# Patient Record
Sex: Male | Born: 1975 | Race: White | Hispanic: Yes | Marital: Married | Smoking: Never smoker
Health system: Southern US, Community
[De-identification: ages and names within clinical notes are randomized; demographics above are authoritative.]

## PROBLEM LIST (undated history)

## (undated) DIAGNOSIS — K219 Gastro-esophageal reflux disease without esophagitis: Secondary | ICD-10-CM

## (undated) HISTORY — DX: Gastro-esophageal reflux disease without esophagitis: K21.9

---

## 2013-01-19 ENCOUNTER — Ambulatory Visit: Payer: Self-pay | Admitting: Family Medicine

## 2013-01-19 VITALS — BP 138/82 | HR 86 | Temp 98.5°F | Resp 16 | Ht 65.0 in | Wt 155.0 lb

## 2013-01-19 DIAGNOSIS — J019 Acute sinusitis, unspecified: Secondary | ICD-10-CM

## 2013-01-19 MED ORDER — FLUTICASONE PROPIONATE 50 MCG/ACT NA SUSP: 2.0000 | Freq: Every day | NASAL | Status: DC

## 2013-01-19 MED ORDER — AMOXICILLIN 875 MG PO TABS: 875.0000 mg | ORAL_TABLET | Freq: Two times a day (BID) | ORAL | Status: DC

## 2013-01-19 NOTE — Patient Instructions (Addendum)
Sinusitis  (Sinusitis) La sinusitis es el enrojecimiento, dolor e hinchazn (inflamacin) de los senos paranasales. Los senos paranasales son bolsas de aire que se encuentran dentro de los huesos del rostro (por debajo de los ojos, en la mitad de la frente o por encima de los ojos). En los senos paranasales sanos, el moco es capaz de drenar y el aire circula a travs de ellos en su camino hacia la nariz. Sin embargo, cuando se inflaman, el moco y el aire quedan atrapados. Esto hace que se desarrollen bacterias y otros grmenes y originen una infeccin.   La sinusitis puede desarrollarse rpidamente y durar slo un tiempo corto (aguda) o continuar por un perodo largo (crnica).. La sinusitis que dura ms de 12 semanas se considera crnica.  CAUSAS  Las causas de la sinusitis son:   Alergias.  Las anomalas estructurales, como el desplazamiento del cartlago que separa las fosas nasales (desvo del tabique) pueden disminuir el flujo de aire por la nariz y los senos paranasales y afectar su drenaje.  Las alteraciones funcionales, como cuando los pequeos pelos (cilias) que se encuentran en los senos nasales y ayudan a eliminar la mucosidad no funcionan correctamente o no estn presentes. SNTOMAS  Los sntomas de sinusitis aguda y crnica son los mismos. Los sntomas principales son el dolor y la presin alrededor de los senos paranasales afectados. Otros sntomas son:   Dolor en los dientes superiores.  Dolor de odos  Dolor de cabeza.  Mal aliento.  Disminucin del sentido del olfato y del gusto.  Tos, que empeora al acostarse.  Fatiga.  Fiebre.  Drenaje de moco espeso por la nariz, que generalmente es de color verde y puede contener pus (purulento).  Hinchazn y calor en los senos paranasales afectados. DIAGNSTICO  El mdico le har un examen fsico. Durante el examen, el mdico:   Revisar su nariz buscando signos de crecimientos anormales en las fosas nasales (plipos  nasales).  Palpar los senos paranasales afectados para buscar signos de infeccin.  Observar el interior de los senos paranasales (endoscopa) con un dispositivo luminoso especial (endoscopio) con el que tomar imgenes insertndolo en los senos paranasales. Si el mdico sospecha que usted sufre sinusitis crnica, podr indicar una o ms de las siguientes pruebas:   Pruebas de alergia.  Cultivo de secreciones nasales: tomar una muestra del moco nasal y lo enviar a un laboratorio para detectar bacterias.  Citologa nasal: el mdico tomar una muestra de moco de la nariz para determinar si la sinusitis que usted sufre est relacionada con una alergia. TRATAMIENTO  La mayora de los casos de sinusitis aguda se deben a una infeccin viral y se resuelven espontneamente dentro de los 10 das. En algunos casos se recetan medicamentos para aliviar los sntomas (analgsicos, descongestivos, aerosoles nasales con corticoides o aerosoles salinos).  Sin embargo, para la sinusitis por infeccin bacteriana, el mdico le recetar antibiticos. Los antibiticos son medicamentos que destruyen las bacterias que causan la infeccin.  Rara vez la sinusitis tiene su origen en una infeccin por hongos. . En estos casos, el mdico le recetar un medicamento antifngico.  Para algunos casos de sinusitis crnica, es necesario someterse a una ciruga. Generalmente se trata de casos en los que la sinusitis se repite ms de 3 veces al ao, a pesar de otros tratamientos.  INSTRUCCIONES PARA EL CUIDADO EN EL HOGAR   Beba gran cantidad de lquidos. Los lquidos ayudan a disolver el moco para que drene ms fcilmente de los senos paranasales.    Use un humidificador.  Inhale vapor de 3 a 4 veces al da (por ejemplo, sintese en el bao con la ducha abierta).  Aplique un pao tibio y hmedo en el rostro 3  4 veces al da, o segn las indicaciones de su mdico.  Use un aerosol nasal salino para ayudar a Environmental education officer y  [employer] senos nasales.  Tome medicamentos de venta libre o recetados para Acupuncturist, Environmental health practitioner o la fiebre slo segn las indicaciones de su mdico. SOLICITE ATENCIN MDICA DE INMEDIATO SI:   Siente ms dolor o sufre dolores de cabeza intensos.  Tiene nuseas, vmitos o somnolencia.  Observa hinchazn alrededor del rostro.  Tiene problemas de visin.  Presenta rigidez en el cuello.  Tiene dificultad para respirar. ASEGRESE DE QUE:   Comprende estas instrucciones.  Controlar su enfermedad.  Solicitar ayuda de inmediato si no mejora o si empeora. Document Released: 08/02/2005 Document Revised: 01/15/2012 [facility] Patient Information 2013 [place], [place].

## 2013-01-19 NOTE — Progress Notes (Signed)
Patient ID: Ivan Rodriguez MRN: [id number], DOB: 01-25-1976, 37 y.o. Date of Encounter: 01/19/2013, 6:09 PM  Primary Physician: No primary provider on file.  Chief Complaint:  Chief Complaint  Patient presents with  . Sinusitis    x 1 week    HPI: 37 y.o. year old male presents with 21 day history of nasal congestion, post nasal drip, sore throat, sinus pressure, and cough. Afebrile. No chills. Nasal congestion thick and green/yellow. Sinus pressure is the worst symptom. Cough is productive secondary to post nasal drip and not associated with time of day. Ears feel full, leading to sensation of muffled hearing. Has tried OTC cold preps without success. 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   Some epistaxis.  Last sinus infection 5 years ago.  No allergy hx.  No recent antibiotics, recent travels, or sick contacts   No leg trauma, sedentary periods, h/o cancer, or tobacco use.  History reviewed. No pertinent past medical history.   Home Meds: Prior to Admission medications   Medication Sig Start Date End Date Taking? Authorizing Provider  amoxicillin (AMOXIL) 875 MG tablet Take 1 tablet (875 mg total) by mouth 2 (two) times daily. 01/19/13   Elvina Sidle, MD  fluticasone (FLONASE) 50 MCG/ACT nasal spray Place 2 sprays into the nose daily. 01/19/13   Elvina Sidle, MD    Allergies: No Known Allergies  History   Social History  . Marital Status: Married    Spouse Name: N/A    Number of Children: N/A  . Years of Education: N/A   Occupational History  . Not on file.   Social History Main Topics  . Smoking status: Never Smoker   . Smokeless tobacco: Not on file  . Alcohol Use: Not on file  . Drug Use: Not on file  . Sexually Active: Not on file   Other Topics Concern  . Not on file   Social History Narrative  . No narrative on file     Review of Systems: Constitutional: negative for chills, fever, night sweats or weight changes Cardiovascular:  negative for chest pain or palpitations Respiratory: negative for hemoptysis, wheezing, or shortness of breath Abdominal: negative for abdominal pain, nausea, vomiting or diarrhea Dermatological: negative for rash Neurologic: negative for headache   Physical Exam: Blood pressure 138/82, pulse 86, temperature 98.5 F (36.9 C), temperature source Oral, resp. rate 16, height 5\' 5"  (1.651 m), weight 155 lb (70.308 kg), SpO2 96.00%., Body mass index is 25.79 kg/(m^2). General: Well developed, well nourished, in no acute distress. Head: Normocephalic, atraumatic, eyes without discharge, sclera non-icteric, nares are congested. Bilateral auditory canals clear, TM's are without perforation, pearly grey with reflective cone of light bilaterally. Serous effusion bilaterally behind TM's. Maxillary sinus TTP. Oral cavity moist, dentition normal. Posterior pharynx with post nasal drip and mild erythema. No peritonsillar abscess or tonsillar exudate. Neck: Supple. No thyromegaly. Full ROM. No lymphadenopathy. Lungs: Clear bilaterally to auscultation without wheezes, rales, or rhonchi. Breathing is unlabored.  Heart: RRR with S1 S2. No murmurs, rubs, or gallops appreciated. Msk:  Strength and tone normal for age. Extremities: No clubbing or cyanosis. No edema. Neuro: Alert and oriented X 3. Moves all extremities spontaneously. CNII-XII grossly in tact. Psych:  Responds to questions appropriately with a normal affect.   Labs:   ASSESSMENT AND PLAN:  37 y.o. year old male with sinusitis Acute sinusitis - Plan: amoxicillin (AMOXIL) 875 MG tablet, fluticasone (FLONASE) 50 MCG/ACT nasal spray   -  -Tylenol/Motrin prn -Rest/fluids -  RTC precautions -RTC 3-5 days if no improvement  Signed, Elvina Sidle, MD 01/19/2013 6:09 PM

## 2013-04-01 ENCOUNTER — Ambulatory Visit: Payer: Self-pay | Admitting: Family Medicine

## 2013-04-01 VITALS — BP 110/70 | HR 78 | Temp 97.9°F | Resp 16 | Ht 66.0 in | Wt 152.0 lb

## 2013-04-01 DIAGNOSIS — R197 Diarrhea, unspecified: Secondary | ICD-10-CM

## 2013-04-01 DIAGNOSIS — R1013 Epigastric pain: Secondary | ICD-10-CM

## 2013-04-01 DIAGNOSIS — R112 Nausea with vomiting, unspecified: Secondary | ICD-10-CM

## 2013-04-01 LAB — COMPREHENSIVE METABOLIC PANEL
ALT: 21 U/L (ref 0–53)
AST: 21 U/L (ref 0–37)
Albumin: 4.9 g/dL (ref 3.5–5.2)
Alkaline Phosphatase: 63 U/L (ref 39–117)
BUN: 17 mg/dL (ref 6–23)
Potassium: 4.2 mEq/L (ref 3.5–5.3)
Sodium: 140 mEq/L (ref 135–145)

## 2013-04-01 LAB — POCT CBC
Granulocyte percent: 86.4 %G — AB (ref 37–80)
MCV: 89.2 fL (ref 80–97)
MID (cbc): 0.6 (ref 0–0.9)
MPV: 9.9 fL (ref 0–99.8)
POC Granulocyte: 9.1 — AB (ref 2–6.9)
POC LYMPH PERCENT: 8.1 %L — AB (ref 10–50)
POC MID %: 5.5 %M (ref 0–12)
Platelet Count, POC: 155 10*3/uL (ref 142–424)
RBC: 5.4 M/uL (ref 4.69–6.13)
RDW, POC: 14 %

## 2013-04-01 LAB — POCT UA - MICROSCOPIC ONLY
Casts, Ur, LPF, POC: NEGATIVE
Crystals, Ur, HPF, POC: NEGATIVE
Yeast, UA: NEGATIVE

## 2013-04-01 LAB — POCT URINALYSIS DIPSTICK
Blood, UA: NEGATIVE
Protein, UA: NEGATIVE
Spec Grav, UA: >=1.03
Urobilinogen, UA: 0.2

## 2013-04-01 MED ORDER — DICYCLOMINE HCL 10 MG PO CAPS: ORAL_CAPSULE | ORAL | Status: DC

## 2013-04-01 MED ORDER — ONDANSETRON 4 MG PO TBDP: ORAL_TABLET | ORAL | Status: DC

## 2013-04-01 NOTE — Patient Instructions (Signed)
Drink plenty of fluids  Return if abruptly worse  If the medications that I prescribed did not help the diarrhea, you can also take over-the-counter Imodium.

## 2013-04-01 NOTE — Progress Notes (Signed)
Subjective: 37 year old male who does not speak much [place]. He has an interpreter with him. Last night he awakened about 2 AM with epigastric pain. He vomited several times, last time on the way over here. He also had diarrhea. He has not had any fever. He did drink about 4 beers 2 days ago. He had eaten some things and he wondered whether that could have upset his stomach. No one else in the family is sick. He does have a history of getting some diarrheal problems intermittently. The cramping pain is not present right now, the though it has come and gone.   He is married and has a child. He works as a Occupational hygienist.  Objective: Healthy-appearing man in no major distress. Chest clear. Heart regular without murmurs. Abdomen had diminished bowel sounds, but occasional was present. Abdomen soft without megaly or masses. Mild epigastric tenderness.  Assessment: Abdominal pain Nausea and vomiting Diarrhea Gastroenteritis  Plan: The patient would feel more comfortable recheck labs on him, though I explained that symptomatic treatment was probably all that was needed. We'll check a CBC C. met and UA on him and be back within a few minutes. Neck    Results for orders placed in visit on 04/01/13  POCT CBC      Result Value Range   WBC 10.5 (*) 4.6 - 10.2 K/uL   Lymph, poc 0.9  0.6 - 3.4   POC LYMPH PERCENT 8.1 (*) 10 - 50 %L   MID (cbc) 0.6  0 - 0.9   POC MID % 5.5  0 - 12 %M   POC Granulocyte 9.1 (*) 2 - 6.9   Granulocyte percent 86.4 (*) 37 - 80 %G   RBC 5.40  4.69 - 6.13 M/uL   Hemoglobin 15.8  14.1 - 18.1 g/dL   HCT, POC 84.6  96.2 - 53.7 %   MCV 89.2  80 - 97 fL   MCH, POC 29.3  27 - 31.2 pg   MCHC 32.8  31.8 - 35.4 g/dL   RDW, POC 95.2     Platelet Count, POC 155  142 - 424 K/uL   MPV 9.9  0 - 99.8 fL  POCT UA - MICROSCOPIC ONLY      Result Value Range   WBC, Ur, HPF, POC 1-3     RBC, urine, microscopic 0-2     Bacteria, U Microscopic small     Mucus, UA 2+     Epithelial cells, urine per micros 2-5     Crystals, Ur, HPF, POC neg     Casts, Ur, LPF, POC neg     Yeast, UA neg    POCT URINALYSIS DIPSTICK      Result Value Range   Color, UA yellow     Clarity, UA clear     Glucose, UA neg     Bilirubin, UA small     Ketones, UA neg     Spec Grav, UA >=1.030     Blood, UA neg     pH, UA 5.0     Protein, UA neg     Urobilinogen, UA 0.2     Nitrite, UA neg     Leukocytes, UA Negative

## 2013-04-02 ENCOUNTER — Encounter: Payer: Self-pay | Admitting: *Deleted

## 2017-06-09 ENCOUNTER — Encounter: Payer: Self-pay | Admitting: Emergency Medicine

## 2017-06-09 ENCOUNTER — Ambulatory Visit (INDEPENDENT_AMBULATORY_CARE_PROVIDER_SITE_OTHER): Payer: Self-pay | Admitting: Emergency Medicine

## 2017-06-09 VITALS — BP 126/77 | HR 65 | Temp 98.6°F | Resp 18 | Ht 66.14 in | Wt 162.0 lb

## 2017-06-09 DIAGNOSIS — M7022 Olecranon bursitis, left elbow: Secondary | ICD-10-CM | POA: Insufficient documentation

## 2017-06-09 DIAGNOSIS — M25522 Pain in left elbow: Secondary | ICD-10-CM

## 2017-06-09 MED ORDER — IBUPROFEN 600 MG PO TABS: 600.0000 mg | ORAL_TABLET | Freq: Three times a day (TID) | ORAL | 1 refills | Status: DC | PRN

## 2017-06-09 MED ORDER — CEPHALEXIN 500 MG PO CAPS: 500.0000 mg | ORAL_CAPSULE | Freq: Three times a day (TID) | ORAL | 0 refills | Status: AC

## 2017-06-09 NOTE — Progress Notes (Signed)
Ivan Rodriguez 41 y.o.   Chief Complaint  Patient presents with  . Elbow Pain    left elbow x 3 days     HISTORY OF PRESENT ILLNESS: This is a 41 y.o. male complaining of left elbow pain x 3 days; denies injury.  HPI   Prior to Admission medications   Not on File    No Known Allergies  There are no active problems to display for this patient.   History reviewed. No pertinent past medical history.  History reviewed. No pertinent surgical history.  Social History   Social History  . Marital status: Married    Spouse name: N/A  . Number of children: N/A  . Years of education: N/A   Occupational History  . Not on file.   Social History Main Topics  . Smoking status: Never Smoker  . Smokeless tobacco: Never Used  . Alcohol use Not on file  . Drug use: Unknown  . Sexual activity: Not on file   Other Topics Concern  . Not on file   Social History Narrative  . No narrative on file    History reviewed. No pertinent family history.   Review of Systems  Constitutional: Negative for chills and fever.  HENT: Negative.   Eyes: Negative.   Respiratory: Negative for cough and shortness of breath.   Cardiovascular: Negative for chest pain and palpitations.  Gastrointestinal: Negative for nausea and vomiting.  Musculoskeletal: Positive for joint pain.  Skin: Positive for rash.  Neurological: Negative.   Endo/Heme/Allergies: Negative.   All other systems reviewed and are negative.     Vitals:   06/09/17 1458  BP: 126/77  Pulse: 65  Resp: 18  Temp: 98.6 F (37 C)    Physical Exam  Constitutional: He is oriented to person, place, and time. He appears well-developed and well-nourished.  HENT:  Head: Normocephalic and atraumatic.  Eyes: Pupils are equal, round, and reactive to light. EOM are normal.  Neck: Normal range of motion. Neck supple.  Cardiovascular: Normal rate and regular rhythm.   Pulmonary/Chest: Effort normal.  Musculoskeletal:   Left elbow: FROM; +erythema and mild swelling; NVI  Neurological: He is alert and oriented to person, place, and time. No sensory deficit. He exhibits normal muscle tone.  Skin: Skin is warm and dry. Capillary refill takes less than 2 seconds. No rash noted.  Psychiatric: He has a normal mood and affect. His behavior is normal.  Vitals reviewed.    ASSESSMENT & PLAN: [name] was seen today for elbow pain.  Diagnoses and all orders for this visit:  Left elbow pain  Olecranon bursitis of left elbow Comments: possible infection  Other orders -     cephALEXin (KEFLEX) 500 MG capsule; Take 1 capsule (500 mg total) by mouth 3 (three) times daily. -     ibuprofen (ADVIL,MOTRIN) 600 MG tablet; Take 1 tablet (600 mg total) by mouth every 8 (eight) hours as needed.    Patient Instructions       IF you received an x-ray today, you will receive an invoice from [facility]. Please contact [facility] at [phone] with questions or concerns regarding your invoice.   IF you received labwork today, you will receive an invoice from [facility]. Please contact [facility] at [phone] with questions or concerns regarding your invoice.   Our billing staff will not be able to assist you with questions regarding bills from these companies.  You will be contacted with the lab results as soon as  they are available. The fastest way to get your results is to activate your My Chart account. Instructions are located on the last page of this paperwork. If you have not heard from us regarding the results in 2 weeks, please contact this office.      Bursitis (Bursitis) Se produce bursitis cuando la bolsa llena de lquido (bursa) que cubre y protege una articulacin se hincha (inflama). La bursitis es ms frecuente cerca de las articulaciones, especialmente de las rodillas, los codos, las caderas y los hombros. CUIDADOS EN EL HOGAR  Tome los medicamentos solamente como se lo haya  indicado el mdico.  Si le recetaron antibiticos, asegrese de terminarlos, incluso si comienza a sentirse mejor.  Haga reposo a fin de Scientist, water qualitydescansar el rea afectada, como se lo haya indicado el mdico. ? Mantenga el rea elevada. ? Evite hacer cosas que Warden/rangerempeoren el dolor.  Aplique hielo sobre la zona lesionada. ? Coloque el hielo en una bolsa plstica. ? Coloque una First[employer] Corptoalla entre la piel y la bolsa de hielo. ? Coloque el hielo durante 20 minutos, 2 a 3 veces por da.  Use la frula, el dispositivo ortopdico, la almohadilla o el dispositivo para caminar como se lo haya indicado el mdico.  Concurra a todas las visitas de control como se lo haya indicado el mdico. Esto es importante. SOLICITE AYUDA SI:  Tiene ms dolor con los Medical laboratory scientific officercuidados en el hogar.  Tiene fiebre.  Tiene escalofros. Esta informacin no tiene Theme park managercomo fin reemplazar el consejo del mdico. Asegrese de hacerle al mdico cualquier pregunta que tenga. Document Released: 10/12/2011 Document Revised: 11/13/2014 Document Reviewed: 01/12/2014 Elsevier Interactive Patient Education  2018 Elsevier Inc.     Edwina BarthMiguel Jaylei Fuerte, MD [facility]

## 2017-06-09 NOTE — Patient Instructions (Addendum)
     IF you received an x-ray today, you will receive an invoice from [facility]. Please contact [facility] at [phone] with questions or concerns regarding your invoice.   IF you received labwork today, you will receive an invoice from [facility]. Please contact [facility] at [phone] with questions or concerns regarding your invoice.   Our billing staff will not be able to assist you with questions regarding bills from these companies.  You will be contacted with the lab results as soon as they are available. The fastest way to get your results is to activate your My Chart account. Instructions are located on the last page of this paperwork. If you have not heard from us regarding the results in 2 weeks, please contact this office.      Bursitis (Bursitis) Se produce bursitis cuando la bolsa llena de lquido (bursa) que cubre y protege una articulacin se hincha (inflama). La bursitis es ms frecuente cerca de las articulaciones, especialmente de las rodillas, los codos, las caderas y los hombros. CUIDADOS EN EL HOGAR  Tome los medicamentos solamente como se lo haya indicado el mdico.  Si le recetaron antibiticos, asegrese de terminarlos, incluso si comienza a sentirse mejor.  Haga reposo a fin de descansar el rea afectada, como se lo haya indicado el mdico. ? Mantenga el rea elevada. ? Evite hacer cosas que empeoren el dolor.  Aplique hielo sobre la zona lesionada. ? Coloque el hielo en una bolsa plstica. ? Coloque una toalla entre la piel y la bolsa de hielo. ? Coloque el hielo durante 20 minutos, 2 a 3 veces por da.  Use la frula, el dispositivo ortopdico, la almohadilla o el dispositivo para caminar como se lo haya indicado el mdico.  Concurra a todas las visitas de control como se lo haya indicado el mdico. Esto es importante. SOLICITE AYUDA SI:  Tiene ms dolor con los cuidados en el hogar.  Tiene fiebre.  Tiene escalofros. Esta  informacin no tiene como fin reemplazar el consejo del mdico. Asegrese de hacerle al mdico cualquier pregunta que tenga. Document Released: 10/12/2011 Document Revised: 11/13/2014 Document Reviewed: 01/12/2014 Elsevier Interactive Patient Education  2018 Elsevier Inc.  

## 2019-01-27 ENCOUNTER — Encounter (HOSPITAL_COMMUNITY): Payer: Self-pay

## 2019-01-27 ENCOUNTER — Other Ambulatory Visit: Payer: Self-pay

## 2019-01-27 ENCOUNTER — Ambulatory Visit (HOSPITAL_COMMUNITY)
Admission: EM | Admit: 2019-01-27 | Discharge: 2019-01-27 | Disposition: A | Discharge status: Home / Self Care | Payer: Self-pay | Attending: Internal Medicine | Admitting: Internal Medicine

## 2019-01-27 ENCOUNTER — Ambulatory Visit (INDEPENDENT_AMBULATORY_CARE_PROVIDER_SITE_OTHER): Payer: Self-pay

## 2019-01-27 DIAGNOSIS — R1013 Epigastric pain: Secondary | ICD-10-CM

## 2019-01-27 DIAGNOSIS — K2100 Gastro-esophageal reflux disease with esophagitis, without bleeding: Secondary | ICD-10-CM

## 2019-01-27 DIAGNOSIS — K5900 Constipation, unspecified: Secondary | ICD-10-CM

## 2019-01-27 DIAGNOSIS — K219 Gastro-esophageal reflux disease without esophagitis: Secondary | ICD-10-CM | POA: Insufficient documentation

## 2019-01-27 DIAGNOSIS — K5909 Other constipation: Secondary | ICD-10-CM

## 2019-01-27 MED ORDER — ALUM & MAG HYDROXIDE-SIMETH 200-200-20 MG/5ML PO SUSP
ORAL | Status: AC
  Filled 2019-01-27: qty 30

## 2019-01-27 MED ORDER — OMEPRAZOLE 40 MG PO CPDR: 40.0000 mg | DELAYED_RELEASE_CAPSULE | Freq: Every day | ORAL | 1 refills | Status: DC

## 2019-01-27 MED ORDER — ALUM & MAG HYDROXIDE-SIMETH 200-200-20 MG/5ML PO SUSP
30.0000 mL | Freq: Once | ORAL | Status: AC
  Administered 2019-01-27: 30 mL via ORAL

## 2019-01-27 MED ORDER — HYOSCYAMINE SULFATE 0.125 MG SL SUBL: 0.2500 mg | SUBLINGUAL_TABLET | Freq: Once | SUBLINGUAL | Status: DC

## 2019-01-27 NOTE — ED Triage Notes (Signed)
Pt cc states his acid reflux is flaring up. X 4 days.

## 2019-01-27 NOTE — ED Provider Notes (Signed)
[facility]    CSN: [id number] Arrival date & time: 01/27/19  1546     History   Chief Complaint Chief Complaint  Patient presents with   acid reflux    HPI Ivan Rodriguez is a 43 y.o. male.   For the past 3 days has been having  Epigastric fullness and feeling bitter stuff coming to his throat and feels pressure feeling moving up his chest almost to his nostrils. No fever, blood in stool and N/V/D. Denies taking any meds for symptoms. Has felt sometimes his food getting stuck in his esophagus and he cant digest his food well. Has noticed intermittent burning in his esophagus when he swallows some warm food and certain spicy foods. Used to get them intermittent and no in the past 3 days is constant.  He normally has BM's twice a day, but 4 out 7 days been only going ones a day, because he cant go while at work, so he hold it til he gets home at night time.      History reviewed. No pertinent past medical history.  Patient Active Problem List   Diagnosis Date Noted   Left elbow pain 06/09/2017   Olecranon bursitis of left elbow 06/09/2017    History reviewed. No pertinent surgical history.     Home Medications    Prior to Admission medications   Medication Sig Start Date End Date Taking? Authorizing Provider  omeprazole (PRILOSEC) 40 MG capsule Take 1 capsule (40 mg total) by mouth daily. 01/27/19   Rodriguez-Southworth, Nettie Elm, PA-C    Family History Family History  Problem Relation Age of Onset   Hypertension Mother    Gallstones Mother    Throat cancer Father     Social History Social History   Tobacco Use   Smoking status: Never Smoker   Smokeless tobacco: Never Used  Substance Use Topics   Alcohol use: Not Currently   Drug use: Not on file     Allergies   Patient has no known allergies.   Review of Systems Review of Systems  Constitutional: Negative for chills, diaphoresis and fever.  HENT: Negative for  congestion, postnasal drip, rhinorrhea and sore throat.   Respiratory: Positive for chest tightness. Negative for cough, choking, shortness of breath and wheezing.   Cardiovascular: Negative for chest pain, palpitations and leg swelling.  Gastrointestinal: Positive for abdominal pain and constipation. Negative for abdominal distention, blood in stool, diarrhea, nausea and vomiting.       Gets reflux off and on. Has sensation that food gets stuck in his throat.   Genitourinary: Negative for difficulty urinating.  Musculoskeletal: Negative for gait problem, myalgias, neck pain and neck stiffness.  Skin: Negative for rash and wound.  Neurological: Negative for dizziness, weakness and light-headedness.  Hematological: Negative for adenopathy.   Physical Exam Triage Vital Signs ED Triage Vitals  Enc Vitals Group     BP 01/27/19 1614 (!) 142/80     Pulse Rate 01/27/19 1614 62     Resp 01/27/19 1614 18     Temp 01/27/19 1614 98.6 F (37 C)     Temp Source 01/27/19 1614 Oral     SpO2 01/27/19 1614 100 %     Weight 01/27/19 1616 165 lb (74.8 kg)     Height --      Head Circumference --      Peak Flow --      Pain Score 01/27/19 1616 6     Pain Loc --  Pain Edu? --      Excl. in GC? --    No data found.  Updated Vital Signs BP (!) 142/80 (BP Location: Right Arm)    Pulse 62    Temp 98.6 F (37 C) (Oral)    Resp 18    Wt 165 lb (74.8 kg)    SpO2 100%    BMI 26.52 kg/m   Visual Acuity Right Eye Distance:   Left Eye Distance:   Bilateral Distance:    Right Eye Near:   Left Eye Near:    Bilateral Near:     Physical Exam Physical Exam Vitals signs and nursing note reviewed.  Constitutional:      General: She is not in acute distress.    Appearance: Normal appearance. She is not ill-appearing, toxic-appearing or diaphoretic.  HENT:     Head: Normocephalic.     Right Ear: Tympanic membrane, ear canal and external ear normal.     Left Ear: Tympanic membrane, ear canal and  external ear normal.     Nose: Nose normal.     Mouth/Throat:     Mouth: Mucous membranes are moist.  Eyes:     General: No scleral icterus.       Right eye: No discharge.        Left eye: No discharge.     Conjunctiva/sclera: Conjunctivae normal.  Neck:     Musculoskeletal: Neck supple. No neck rigidity.  Cardiovascular:     Rate and Rhythm: Normal rate and regular rhythm.     Heart sounds: No murmur.  Pulmonary:     Effort: Pulmonary effort is normal.     Breath sounds: Normal breath sounds.  Abdominal:     General: Bowel sounds are normal. There is no distension.     Palpations: Abdomen is soft. There is no mass or organoegally.     Tenderness: There is no abdominal tenderness, but the pressure was provoked with palpation of epigastric region and LUQ. There is no guarding or rebound.     Hernia: No hernia is present.  Musculoskeletal: Normal range of motion.  Lymphadenopathy:     Cervical: No cervical adenopathy.  Skin:    General: Skin is warm and dry.     Coloration: Skin is not jaundiced.     Findings: No rash.  Neurological:     Mental Status: She is alert and oriented to person, place, and time.     Gait: Gait normal.  Psychiatric:        Mood and Affect: Mood normal.        Behavior: Behavior normal.        Thought Content: Thought content normal.        Judgment: Judgment normal.    UC Treatments / Results  Labs (all labs ordered are listed, but only abnormal results are displayed) Labs Reviewed - No data to display  EKG None  Radiology Dg Abd 1 View  Result Date: 01/27/2019 CLINICAL DATA:  Epigastric pressure; constipation EXAM: ABDOMEN - 1 VIEW COMPARISON:  None. FINDINGS: There is moderate stool throughout the colon. There is no bowel dilatation or air-fluid level to suggest bowel obstruction. No free air. No abnormal calcifications. IMPRESSION: Moderate stool in colon.  No bowel obstruction or free air evident. Electronically Signed   By: Bretta Bang III M.D.   On: 01/27/2019 18:47    Procedures Procedures  Medications Ordered in UC Medications  alum & mag hydroxide-simeth (MAALOX/MYLANTA) 200-200-20 MG/5ML suspension 30  mL (30 mLs Oral Given 01/27/19 1657)    Initial Impression / Assessment and Plan / UC Course  I have reviewed the triage vital signs and the nursing notes. He was given a GI cocktail and his discomfort was minimally alleviated, went from 8/10 to 6/10 and by discharge time was 2/10.  Pertinent  imaging results that were available during my care of the patient were reviewed by me and considered in my medical decision making (see chart for details). I advised him to get Magnesium citrate to help him empty out his colon since he has a large amt . Of stool on ascending colon.  I placed him on Prilosec 40 mg qd x 4 weeks and if in 2-3 weeks he is not better, then needs to see Gi. If they require him to be referred, then he can come see me as PCP and I will ref. Pt agrees.    Final Clinical Impressions(s) / UC Diagnoses   Final diagnoses:  Gastroesophageal reflux disease with esophagitis     Discharge Instructions     Come papaya con todas tus comidas para que te ayude a [employer] Prilosec 40 mg y tome una cada manana 30 minutos antes del desayuno. Trate por 2-4 semanas, so no te ayuda, tendras que ver a un especialista de estomago.   Compra una botella que se llama Magnesium Citrate to toma toda el dia antes que no trabajes para baciar el intestino largo.     ED Prescriptions    Medication Sig Dispense Auth. Provider   omeprazole (PRILOSEC) 40 MG capsule Take 1 capsule (40 mg total) by mouth daily. 4 capsule Rodriguez-Southworth, Nettie Elm, PA-C     Controlled Substance Prescriptions [place] Controlled Substance Registry consulted?    Garey Ham, PA-C 01/27/19 2030

## 2019-01-27 NOTE — Discharge Instructions (Addendum)
Come papaya con todas tus comidas para que te ayude a Scientist, forensic Prilosec 40 mg y tome una cada manana 30 minutos antes del desayuno. Trate por 2-4 semanas, so no te ayuda, tendras que ver a un especialista de estomago.   Compra una botella que se llama Magnesium Citrate to toma toda el dia antes que no trabajes para baciar el intestino largo.

## 2019-01-28 ENCOUNTER — Telehealth (HOSPITAL_COMMUNITY): Payer: Self-pay

## 2019-01-29 MED ORDER — OMEPRAZOLE 40 MG PO CPDR: 40.0000 mg | DELAYED_RELEASE_CAPSULE | Freq: Every day | ORAL | 1 refills | Status: DC

## 2019-01-29 NOTE — Telephone Encounter (Signed)
I resent Prilosec Rx and answered multiple questions.

## 2019-02-23 ENCOUNTER — Ambulatory Visit (INDEPENDENT_AMBULATORY_CARE_PROVIDER_SITE_OTHER): Payer: Self-pay

## 2019-02-23 ENCOUNTER — Other Ambulatory Visit: Payer: Self-pay

## 2019-02-23 ENCOUNTER — Encounter (HOSPITAL_COMMUNITY): Payer: Self-pay | Admitting: Family Medicine

## 2019-02-23 ENCOUNTER — Ambulatory Visit (HOSPITAL_COMMUNITY)
Admission: EM | Admit: 2019-02-23 | Discharge: 2019-02-23 | Disposition: A | Discharge status: Home / Self Care | Payer: Self-pay | Attending: Family Medicine | Admitting: Family Medicine

## 2019-02-23 DIAGNOSIS — S20212A Contusion of left front wall of thorax, initial encounter: Secondary | ICD-10-CM

## 2019-02-23 MED ORDER — PREDNISONE 20 MG PO TABS: ORAL_TABLET | ORAL | 0 refills | Status: DC

## 2019-02-23 NOTE — ED Triage Notes (Signed)
Pt here with fall on Tuesday now having pain in left sided chest worse with movement and cough

## 2019-02-23 NOTE — ED Provider Notes (Signed)
[facility]    CSN: [id number] Arrival date & time: 02/23/19  1312     History   Chief Complaint Chief Complaint  Patient presents with  . Fall    HPI Ivan Rodriguez is a 43 y.o. male.   43 yo man who presents with chest discomfort.  He was seen here a month ago with what was diagnosed as esophagitis.  He fell on Tuesday while climbing onto palates and struck left chest.  Pain began two days later.  No fever or shortness of breath, but very uncomfortable with cough or twisting or lifting with left arm.  His symptoms from a month ago have largely cleared up.  He works [employer] representative.   Note from 01/27/19: For the past 3 days has been having  Epigastric fullness and feeling bitter stuff coming to his throat and feels pressure feeling moving up his chest almost to his nostrils. No fever, blood in stool and N/V/D. Denies taking any meds for symptoms. Has felt sometimes his food getting stuck in his esophagus and he cant digest his food well. Has noticed intermittent burning in his esophagus when he swallows some warm food and certain spicy foods. Used to get them intermittent and no in the past 3 days is constant.  He normally has BM's twice a day, but 4 out 7 days been only going ones a day, because he cant go while at work, so he hold it til he gets home at night time.      History reviewed. No pertinent past medical history.  Patient Active Problem List   Diagnosis Date Noted  . Left elbow pain 06/09/2017  . Olecranon bursitis of left elbow 06/09/2017    History reviewed. No pertinent surgical history.     Home Medications    Prior to Admission medications   Medication Sig Start Date End Date Taking? Authorizing Provider  omeprazole (PRILOSEC) 40 MG capsule Take 1 capsule (40 mg total) by mouth daily. 01/27/19   Rodriguez-Southworth, Nettie Elm, PA-C  omeprazole (PRILOSEC) 40 MG capsule Take 1 capsule (40 mg total) by mouth daily. 01/29/19 01/29/20   Rodriguez-Southworth, Nettie Elm, PA-C  predniSONE (DELTASONE) 20 MG tablet Two daily with food 02/23/19   Elvina Sidle, MD    Family History Family History  Problem Relation Age of Onset  . Hypertension Mother   . Gallstones Mother   . Throat cancer Father     Social History Social History   Tobacco Use  . Smoking status: Never Smoker  . Smokeless tobacco: Never Used  Substance Use Topics  . Alcohol use: Not Currently  . Drug use: Not on file     Allergies   Patient has no known allergies.   Review of Systems Review of Systems  Constitutional: Negative.   Respiratory: Positive for chest tightness.   Cardiovascular: Positive for chest pain.  Gastrointestinal: Negative.   Neurological: Negative.   All other systems reviewed and are negative.    Physical Exam Triage Vital Signs ED Triage Vitals  Enc Vitals Group     BP      Pulse      Resp      Temp      Temp src      SpO2      Weight      Height      Head Circumference      Peak Flow      Pain Score      Pain Loc  Pain Edu?      Excl. in GC?    No data found.  Updated Vital Signs BP (!) 143/81 (BP Location: Right Arm)   Pulse 65   Temp 98.3 F (36.8 C) (Oral)   Resp 18   SpO2 98%    Physical Exam Vitals signs and nursing note reviewed.  Constitutional:      Appearance: Normal appearance.  HENT:     Head: Normocephalic.     Mouth/Throat:     Mouth: Mucous membranes are moist.  Eyes:     Conjunctiva/sclera: Conjunctivae normal.  Neck:     Musculoskeletal: Normal range of motion and neck supple.  Cardiovascular:     Rate and Rhythm: Normal rate and regular rhythm.     Pulses: Normal pulses.     Heart sounds: Normal heart sounds.  Pulmonary:     Effort: Pulmonary effort is normal.     Breath sounds: Normal breath sounds.  Musculoskeletal:        General: Tenderness and signs of injury present.     Comments: Tender upper left anterior chest about 3 inches above left areola.   Skin:    Findings: Bruising present.  Neurological:     General: No focal deficit present.     Mental Status: He is alert and oriented to person, place, and time.  Psychiatric:        Mood and Affect: Mood normal.        Behavior: Behavior normal.        UC Treatments / Results  Labs (all labs ordered are listed, but only abnormal results are displayed) Labs Reviewed - No data to display  EKG None  Radiology Dg Ribs Unilateral W/chest Left  Result Date: 02/23/2019 CLINICAL DATA:  Acute LEFT chest and rib pain following fall 5 days ago. Initial encounter. EXAM: LEFT RIBS AND CHEST - 3+ VIEW COMPARISON:  None. FINDINGS: A nondisplaced fracture of the anterior LEFT 5th rib is noted. The cardiomediastinal silhouette is unremarkable. There is no evidence of focal airspace disease, pulmonary edema, suspicious pulmonary nodule/mass, pleural effusion, or pneumothorax. No other acute bony abnormalities are identified. IMPRESSION: Nondisplaced LEFT 5th rib fracture. No other significant abnormalities. Electronically Signed   By: Harmon PierJeffrey  Hu M.D.   On: 02/23/2019 14:11    Procedures Procedures (including critical care time)  Medications Ordered in UC Medications - No data to display  Initial Impression / Assessment and Plan / UC Course  I have reviewed the triage vital signs and the nursing notes.  Pertinent labs & imaging results that were available during my care of the patient were reviewed by me and considered in my medical decision making (see chart for details).    Final Clinical Impressions(s) / UC Diagnoses   Final diagnoses:  Contusion of left front wall of thorax, initial encounter   Discharge Instructions   None    ED Prescriptions    Medication Sig Dispense Auth. Provider   predniSONE (DELTASONE) 20 MG tablet Two daily with food 10 tablet Elvina SidleLauenstein, Deania Siguenza, MD     Controlled Substance Prescriptions [place] Controlled Substance Registry consulted? Not Applicable    Elvina SidleLauenstein, Sophiana Milanese, MD 02/23/19 [phone]

## 2019-04-23 ENCOUNTER — Telehealth: Payer: Self-pay

## 2019-04-23 NOTE — Telephone Encounter (Signed)
Patient's wife called to speak with Rosine Abe PA-C. Pt is not a current pt here at [facility] he was seen at the [facility] by her. I advised them that I will have her give them a call tomorrow when she comes into the office. YRL,RMA

## 2019-05-07 ENCOUNTER — Telehealth: Payer: Self-pay

## 2019-05-07 NOTE — Telephone Encounter (Signed)
LVM that upcoming appt has been cancelled, and for pt to call back to reschedule appt 05/07/2019

## 2019-05-08 ENCOUNTER — Ambulatory Visit: Payer: Self-pay | Admitting: Internal Medicine

## 2019-05-29 ENCOUNTER — Encounter: Payer: Self-pay | Admitting: Internal Medicine

## 2019-05-29 ENCOUNTER — Other Ambulatory Visit: Payer: Self-pay

## 2019-05-29 ENCOUNTER — Ambulatory Visit: Payer: Self-pay | Admitting: Internal Medicine

## 2019-05-29 VITALS — BP 128/76 | HR 62 | Temp 98.3°F | Ht 65.2 in | Wt 152.6 lb

## 2019-05-29 DIAGNOSIS — K219 Gastro-esophageal reflux disease without esophagitis: Secondary | ICD-10-CM

## 2019-05-29 DIAGNOSIS — E782 Mixed hyperlipidemia: Secondary | ICD-10-CM

## 2019-05-29 DIAGNOSIS — Z13220 Encounter for screening for lipoid disorders: Secondary | ICD-10-CM

## 2019-05-29 DIAGNOSIS — Z139 Encounter for screening, unspecified: Secondary | ICD-10-CM

## 2019-05-29 MED ORDER — FAMOTIDINE 20 MG PO TABS: ORAL_TABLET | ORAL | 0 refills | Status: AC

## 2019-05-29 NOTE — Progress Notes (Signed)
Subjective:     Patient ID: Ivan Rodriguez , male    DOB: 13-Jun-1976 , 43 y.o.   MRN: [id number]   Chief Complaint  Patient presents with  . Establish Care    HPI Pt is here to establish care with me. I had met him a few months ago when I saw him at the urgent care for GERD issues. I had placed him on Prilosec which helped, but he also stopped eating large amounts of food late at night. GERD is provoked with spicy foods, so as long as he avoids them, he does fine.   2- Would like labs to check cholesterol, and other general panel.    PMHx- GERD   Family History  Problem Relation Age of Onset  . Hypertension Mother   . Gallstones Mother   . Throat cancer Father      Current Outpatient Medications:  .  omeprazole (PRILOSEC) 40 MG capsule, Take 1 capsule (40 mg total) by mouth daily. (Patient not taking: Reported on 05/29/2019), Disp: 4 capsule, Rfl: 1 .  omeprazole (PRILOSEC) 40 MG capsule, Take 1 capsule (40 mg total) by mouth daily. (Patient not taking: Reported on 05/29/2019), Disp: 30 capsule, Rfl: 1 .  predniSONE (DELTASONE) 20 MG tablet, Two daily with food (Patient not taking: Reported on 05/29/2019), Disp: 10 tablet, Rfl: 0   No Known Allergies   Review of Systems  Sometimes wakes up with bitter taste in mouth, does have bad teeth. Denies CP or SOB, gets intermittent coughs with production of white phlegm. Denies polyuria or polyphagia. Denies constipation.Denies abdominal pain. Has been working of cutting down his portion and stopped eating junk food. Tends to burp off and on and does not know if it happens when he has more GERD symptoms or not. Denies abdominal pain or blood in stools.  Today's Vitals   05/29/19 1006  BP: 128/76  Pulse: 62  Temp: 98.3 F (36.8 C)  TempSrc: Oral  SpO2: 97%  Weight: 152 lb 9.6 oz (69.2 kg)  Height: 5' 5.2" (1.656 m)   Body mass index is 25.24 kg/m.   Objective:  Physical Exam   Constitutional: he is oriented to  person, place, and time. he appears well-developed and well-nourished. No distress.  HENT:  Head: Normocephalic and atraumatic.  Right Ear: External ear normal.  Left Ear: External ear normal.  Nose: Nose normal.  Eyes: Conjunctivae are normal. Right eye exhibits no discharge. Left eye exhibits no discharge. No scleral icterus.  Neck: Neck supple. No thyromegaly present.  No carotid bruits bilaterally  Cardiovascular: Normal rate and regular rhythm.  No murmur heard. Pulmonary/Chest: Effort normal and breath sounds normal. No respiratory distress.  Abd- + BS, soft, no masses, tenderness or hepatosplenomegaly.  Musculoskeletal: Normal range of motion. he exhibits no edema.  Lymphadenopathy: has no cervical adenopathy.  Neurological: he is alert and oriented to person, place, and time.  Skin: Skin is warm and dry. Capillary refill takes less than 2 seconds. No rash noted. he is not diaphoretic.  Psychiatric: he has a normal mood and affect. Her behavior is normal. Judgment and thought content normal.  Nursing note reviewed.   Assessment And Plan:    1. Mixed hyperlipidemia- unknown status. I will call him with results.  - Lipid Profile  2. Encounter for screening- routine - CMP14 + Anion Gap - CBC no Diff  3. Gastroesophageal reflux disease without esophagitis- stable watching his diet. Advised to make sure to avoid eating before bed  time and at least wait 3h before he goes to bed. I will have him try Pepsid 20 mg to take before he eats spicy meals. Also told to eat papaya with his meals to help him with digestion.  FU 1 month for GERD FU and physical.     Oneal Biglow RODRIGUEZ-SOUTHWORTH, PA-C    THE PATIENT IS ENCOURAGED TO PRACTICE SOCIAL DISTANCING DUE TO THE COVID-19 PANDEMIC.

## 2019-05-30 LAB — CMP14 + ANION GAP
ALT: 14 IU/L (ref 0–44)
AST: 21 IU/L (ref 0–40)
Albumin/Globulin Ratio: 2.5 — ABNORMAL HIGH (ref 1.2–2.2)
Albumin: 4.8 g/dL (ref 4.0–5.0)
Alkaline Phosphatase: 67 IU/L (ref 39–117)
Anion Gap: 15 mmol/L (ref 10.0–18.0)
BUN/Creatinine Ratio: 24 — ABNORMAL HIGH (ref 9–20)
BUN: 20 mg/dL (ref 6–24)
Bilirubin Total: 0.5 mg/dL (ref 0.0–1.2)
CO2: 21 mmol/L (ref 20–29)
Calcium: 9.6 mg/dL (ref 8.7–10.2)
Chloride: 103 mmol/L (ref 96–106)
Creatinine, Ser: 0.83 mg/dL (ref 0.76–1.27)
GFR calc Af Amer: 126 mL/min/{1.73_m2} (ref >59)
GFR calc non Af Amer: 109 mL/min/{1.73_m2} (ref >59)
Globulin, Total: 1.9 g/dL (ref 1.5–4.5)
Glucose: 95 mg/dL (ref 65–99)
Potassium: 3.9 mmol/L (ref 3.5–5.2)
Sodium: 139 mmol/L (ref 134–144)
Total Protein: 6.7 g/dL (ref 6.0–8.5)

## 2019-05-30 LAB — CBC
Hematocrit: 47.8 % (ref 37.5–51.0)
Hemoglobin: 15.9 g/dL (ref 13.0–17.7)
MCH: 27.9 pg (ref 26.6–33.0)
MCHC: 33.3 g/dL (ref 31.5–35.7)
MCV: 84 fL (ref 79–97)
Platelets: 174 10*3/uL (ref 150–450)
RBC: 5.7 x10E6/uL (ref 4.14–5.80)
RDW: 13.7 % (ref 11.6–15.4)
WBC: 4.3 10*3/uL (ref 3.4–10.8)

## 2019-05-30 LAB — LIPID PANEL
Chol/HDL Ratio: 3 ratio (ref 0.0–5.0)
Cholesterol, Total: 155 mg/dL (ref 100–199)
HDL: 52 mg/dL (ref >39)
LDL Calculated: 88 mg/dL (ref 0–99)
Triglycerides: 77 mg/dL (ref 0–149)
VLDL Cholesterol Cal: 15 mg/dL (ref 5–40)

## 2019-06-19 ENCOUNTER — Telehealth: Payer: Self-pay | Admitting: Internal Medicine

## 2019-06-19 NOTE — Telephone Encounter (Signed)
Was given the date 

## 2019-07-03 ENCOUNTER — Ambulatory Visit: Payer: Self-pay | Admitting: Internal Medicine

## 2019-07-03 ENCOUNTER — Encounter: Payer: Self-pay | Admitting: Internal Medicine

## 2019-07-03 ENCOUNTER — Other Ambulatory Visit: Payer: Self-pay

## 2019-07-03 VITALS — BP 118/78 | HR 61 | Temp 98.2°F | Ht 65.4 in | Wt 154.6 lb

## 2019-07-03 DIAGNOSIS — Z0001 Encounter for general adult medical examination with abnormal findings: Secondary | ICD-10-CM

## 2019-07-03 DIAGNOSIS — Z125 Encounter for screening for malignant neoplasm of prostate: Secondary | ICD-10-CM

## 2019-07-03 DIAGNOSIS — R195 Other fecal abnormalities: Secondary | ICD-10-CM

## 2019-07-03 DIAGNOSIS — Z1211 Encounter for screening for malignant neoplasm of colon: Secondary | ICD-10-CM

## 2019-07-03 LAB — POC HEMOCCULT BLD/STL (OFFICE/1-CARD/DIAGNOSTIC): Fecal Occult Blood, POC: POSITIVE — AB

## 2019-07-03 NOTE — Progress Notes (Signed)
Subjective:     Patient ID: Ivan Rodriguez , male    DOB: 12/30/1975 , 43 y.o.   MRN: [id number]   Chief Complaint  Patient presents with  . Hyperlipidemia  . Gastroesophageal Reflux  . Dysphagia    even when he drinks water    HPI Pt is here for a physical and FU GERD.  Has been in good control with his GERD as long as he watches his diet and eating papaya with meals has also been helping him a lot. Has not needed to try Priolosec any more in the past month. Denies noting black or blood in stools.   PMHx- GERD   Family History  Problem Relation Age of Onset  . Hypertension Mother   . Gallstones Mother   . Throat cancer Father      Current Outpatient Medications:  .  famotidine (PEPCID) 20 MG tablet, Take one 20 minutes prior to eating spicy food., Disp: 60 tablet, Rfl: 0 .  omeprazole (PRILOSEC) 40 MG capsule, Take 1 capsule (40 mg total) by mouth daily. (Patient not taking: Reported on 05/29/2019), Disp: 4 capsule, Rfl: 1 .  omeprazole (PRILOSEC) 40 MG capsule, Take 1 capsule (40 mg total) by mouth daily. (Patient not taking: Reported on 05/29/2019), Disp: 30 capsule, Rfl: 1 .  predniSONE (DELTASONE) 20 MG tablet, Two daily with food (Patient not taking: Reported on 05/29/2019), Disp: 10 tablet, Rfl: 0   No Known Allergies   Review of Systems  + for post nasal drainage off and of, occasional GERD if he eats spicy food or salsa. Rest is neg.  Today's Vitals   07/03/19 0859  BP: 118/78  Pulse: 61  Temp: 98.2 F (36.8 C)  TempSrc: Oral  SpO2: 98%  Weight: 154 lb 9.6 oz (70.1 kg)  Height: 5' 5.4" (1.661 m)   Body mass index is 25.41 kg/m.   Objective:  Physical Exam   BP 118/78   Pulse 61   Temp 98.2 F (36.8 C) (Oral)   Ht 5' 5.4" (1.661 m)   Wt 154 lb 9.6 oz (70.1 kg)   SpO2 98%   BMI 25.41 kg/m   General Appearance:    Alert, cooperative, no distress, appears stated age  Head:    Normocephalic, without obvious abnormality, atraumatic  Eyes:     PERRL, conjunctiva/corneas clear, EOM's intact, fundi    benign, both eyes       Ears:    Normal TM's and external ear canals, both ears  Nose:   Nares normal, septum midline, mucosa normal, no drainage   or sinus tenderness  Throat:   Lips, mucosa, and tongue normal; teeth and gums normal  Neck:   Supple, symmetrical, trachea midline, no adenopathy;       thyroid:  No enlargement/tenderness/nodules; no carotid   bruit or JVD  Back:     Symmetric, no curvature, ROM normal, no CVA tenderness  Lungs:     Clear to auscultation bilaterally, respirations unlabored  Chest wall:    No tenderness or deformity  Heart:    Regular rate and rhythm, S1 and S2 normal, no murmur, rub   or gallop  Abdomen:     Soft, non-tender, bowel sounds active all four quadrants,    no masses, no organomegaly  Genitalia:    Normal male without lesion, discharge or tenderness  Rectal:    Normal tone, normal prostate, no masses or tenderness;   guaiac positive stool  Extremities:   Extremities normal,  atraumatic, no cyanosis or edema  Pulses:   2+ and symmetric all extremities  Skin:   Skin color, texture, turgor normal, no rashes or lesions  Lymph nodes:   Cervical, supraclavicular, and axillary nodes normal  Neurologic:   CNII-XII intact. Normal strength, sensation and reflexes      Throughout. Normal Romberg, tandem gait, heel and tip toe gait.    Assessment And Plan:  1. Screen for colon cancer- screen - POC Hemoccult Bld/Stl (1-Cd Office Dx)  2. Positive occult stool blood test- screen - CBC no Diff - Ambulatory referral to Gastroenterology  3. Screening for prostate cancer- screen - PSA  4. Encounter for general adult medical examination with abnormal findings- routine. FU 1 y.   Ivan Mcglynn RODRIGUEZ-SOUTHWORTH, PA-C    THE PATIENT IS ENCOURAGED TO PRACTICE SOCIAL DISTANCING DUE TO THE COVID-19 PANDEMIC.

## 2019-07-04 ENCOUNTER — Encounter: Payer: Self-pay | Admitting: Gastroenterology

## 2019-07-04 LAB — CBC
Hematocrit: 48.8 % (ref 37.5–51.0)
Hemoglobin: 16.2 g/dL (ref 13.0–17.7)
MCH: 28.4 pg (ref 26.6–33.0)
MCHC: 33.2 g/dL (ref 31.5–35.7)
MCV: 86 fL (ref 79–97)
Platelets: 163 10*3/uL (ref 150–450)
RBC: 5.7 x10E6/uL (ref 4.14–5.80)
RDW: 13.5 % (ref 11.6–15.4)
WBC: 4.7 10*3/uL (ref 3.4–10.8)

## 2019-07-04 LAB — PSA: Prostate Specific Ag, Serum: 0.7 ng/mL (ref 0.0–4.0)

## 2019-07-10 ENCOUNTER — Encounter: Payer: Self-pay | Admitting: Internal Medicine

## 2019-07-10 NOTE — Progress Notes (Signed)
I printed a copy of his results to be mailed to him.

## 2019-07-21 ENCOUNTER — Telehealth: Payer: Self-pay

## 2019-07-21 NOTE — Telephone Encounter (Signed)
Patient notified of labs. YRL,RMA

## 2019-07-24 ENCOUNTER — Encounter: Payer: Self-pay | Admitting: Internal Medicine

## 2019-07-24 ENCOUNTER — Ambulatory Visit: Payer: Self-pay | Admitting: Internal Medicine

## 2019-07-24 ENCOUNTER — Other Ambulatory Visit: Payer: Self-pay

## 2019-07-24 VITALS — BP 124/76 | HR 79 | Temp 98.7°F | Ht 65.2 in | Wt 157.6 lb

## 2019-07-24 DIAGNOSIS — R1013 Epigastric pain: Secondary | ICD-10-CM

## 2019-07-24 MED ORDER — OMEPRAZOLE 40 MG PO CPDR: 40.0000 mg | DELAYED_RELEASE_CAPSULE | Freq: Every day | ORAL | 1 refills | Status: DC

## 2019-07-24 NOTE — Addendum Note (Signed)
Addended by: Nicki Guadalajara on: 07/24/2019 05:35 PM   Modules accepted: Orders

## 2019-07-24 NOTE — Progress Notes (Signed)
Subjective:     Patient ID: Ivan Rodriguez , male    DOB: Jul 05, 1976 , 43 y.o.   MRN: [id number]   Chief Complaint  Patient presents with  . Follow-up    indegstion     HPI Episode of epigastric pain 2 days after I saw him. He had eaten apple and toast with cream cheese and jelly. It came is =waves. Ate dinner, but the pain persisted with sensation he had to have diarrhea, but could not go.  Last week he bent over and felt nausea and had reflux of water he drank. Yesterday he took a pepsid before he went to eat pork and slilghtly spicy salsa and cake. Felt mild epigastric pain so he went to nap at noon. Around 5 pm got up and burped the smell of the food he ate. Pain is not radiating HPI   Past Medical History:  Diagnosis Date  . GERD (gastroesophageal reflux disease)    stable with diet     Family History  Problem Relation Age of Onset  . Hypertension Mother   . Gallstones Mother   . Throat cancer Father      Current Outpatient Medications:  .  famotidine (PEPCID) 20 MG tablet, Take one 20 minutes prior to eating spicy food., Disp: 60 tablet, Rfl: 0 .  omeprazole (PRILOSEC) 40 MG capsule, Take 1 capsule (40 mg total) by mouth daily. (Patient not taking: Reported on 05/29/2019), Disp: 4 capsule, Rfl: 1 .  omeprazole (PRILOSEC) 40 MG capsule, Take 1 capsule (40 mg total) by mouth daily. (Patient not taking: Reported on 05/29/2019), Disp: 30 capsule, Rfl: 1 .  predniSONE (DELTASONE) 20 MG tablet, Two daily with food (Patient not taking: Reported on 05/29/2019), Disp: 10 tablet, Rfl: 0   No Known Allergies   Review of Systems  + epigastric pain, + nausea, sensation he has to have a BM, but cant go. Denies constipation, vomiting, blood in stool or diarrhea.  Today's Vitals   07/24/19 1647  BP: 124/76  Pulse: 79  Temp: 98.7 F (37.1 C)  TempSrc: Oral  Weight: 157 lb 9.6 oz (71.5 kg)  Height: 5' 5.2" (1.656 m)   Body mass index is 26.07 kg/m.   Objective:   Physical Exam Vitals signs and nursing note reviewed.  Constitutional:      Appearance: Normal appearance.  HENT:     Right Ear: External ear normal.     Left Ear: External ear normal.  Eyes:     General: No scleral icterus.    Conjunctiva/sclera: Conjunctivae normal.  Neck:     Musculoskeletal: Neck supple.  Cardiovascular:     Rate and Rhythm: Normal rate and regular rhythm.     Heart sounds: No murmur.  Pulmonary:     Effort: Pulmonary effort is normal.     Breath sounds: Normal breath sounds.  Abdominal:     General: Abdomen is flat.     Palpations: Abdomen is soft.     Tenderness: There is abdominal tenderness. There is no guarding or rebound.     Comments: Has moderate Epigastric tenderness  Musculoskeletal: Normal range of motion.  Skin:    General: Skin is warm.  Neurological:     Mental Status: He is alert and oriented to person, place, and time.     Gait: Gait normal.  Psychiatric:        Mood and Affect: Mood normal.        Behavior: Behavior normal.  Thought Content: Thought content normal.        Judgment: Judgment normal.     Assessment And Plan:    1. Epigastric pain- recurrent and acute. I started him on Prilosec 40 mg q am x 2 months. Has apt with GI for positive hemoccult on 10/8, so if this does not help, and H pilori is neg, can see them for epigastric pain as well.  - H. pylori antigen, stool    Sydell Prowell RODRIGUEZ-SOUTHWORTH, PA-C    THE PATIENT IS ENCOURAGED TO PRACTICE SOCIAL DISTANCING DUE TO THE COVID-19 PANDEMIC.

## 2019-07-29 LAB — H. PYLORI ANTIGEN, STOOL: H pylori Ag, Stl: NEGATIVE

## 2019-08-05 ENCOUNTER — Other Ambulatory Visit: Payer: Self-pay

## 2019-08-05 ENCOUNTER — Encounter: Payer: Self-pay | Admitting: Gastroenterology

## 2019-08-05 ENCOUNTER — Ambulatory Visit: Payer: Self-pay | Admitting: Gastroenterology

## 2019-08-05 VITALS — BP 118/80 | HR 80 | Temp 97.7°F | Ht 65.0 in | Wt 158.0 lb

## 2019-08-05 DIAGNOSIS — K921 Melena: Secondary | ICD-10-CM

## 2019-08-05 DIAGNOSIS — R1013 Epigastric pain: Secondary | ICD-10-CM

## 2019-08-05 NOTE — Patient Instructions (Signed)
You have been scheduled for an endoscopy and colonoscopy. Please follow the written instructions given to you at your visit today. Please pick up your prep supplies at the pharmacy within the next 1-3 days. If you use inhalers (even only as needed), please bring them with you on the day of your procedure. Your physician has requested that you go to www.startemmi.com and enter the access code given to you at your visit today. This web site gives a general overview about your procedure. However, you should still follow specific instructions given to you by our office regarding your preparation for the procedure.  Thank you for entrusting me with your care and choosing [facility].  Dr Jacobs  

## 2019-08-05 NOTE — Progress Notes (Signed)
HPI: This is a very pleasant 43 year old man who was referred to me by Rodriguez-Southworth, S*  to evaluate Hemoccult positive stool, epigastric pain, dysphasia.    He speaks only Spanish, there was a professional interpreter in the room with him  Chief complaint is Hemoccult positive stool, epigastric pain, dysphasia  I am not sure why but he underwent colon cancer screening recently with Hemoccult testing.  This was done in the office.  It was positive.  He does not have a family history of colon cancer.  Lab testing August 2020 showed normal CBC, normal PSA, Hemoccult test was positive for microscopic blood. Lab testing September 2020: H. pylori stool antigen was negative  He has never seen blood in his stool.  He really has no troubles with his bowels.  No overt bleeding, no significant constipation or diarrhea.  For about 3 months he has had epigastric pains intermittently.  His weight is decreased by about 20 pounds but he has gained 10 of it back.  He does not take NSAIDs.  He does periodically have dysphasia to solid foods.  He does not take NSAIDs.  His primary care physician put him on some proton pump inhibitor omeprazole as well as as needed ranitidine and that has seemed to help his epigastric pains.   Review of systems: Pertinent positive and negative review of systems were noted in the above HPI section. All other review negative.   Past Medical History:  Diagnosis Date  . GERD (gastroesophageal reflux disease)    stable with diet    History reviewed. No pertinent surgical history.  Current Outpatient Medications  Medication Sig Dispense Refill  . famotidine (PEPCID) 20 MG tablet Take one 20 minutes prior to eating spicy food. 60 tablet 0  . omeprazole (PRILOSEC) 40 MG capsule Take 1 capsule (40 mg total) by mouth daily. 30 capsule 1   No current facility-administered medications for this visit.     Allergies as of 08/05/2019  . (No Known Allergies)     Family History  Problem Relation Age of Onset  . Hypertension Mother   . Gallstones Mother   . Throat cancer Father     Social History   Socioeconomic History  . Marital status: Married    Spouse name: Not on file  . Number of children: Not on file  . Years of education: Not on file  . Highest education level: Not on file  Occupational History  . Not on file  Social Needs  . Financial resource strain: Not on file  . Food insecurity    Worry: Not on file    Inability: Not on file  . Transportation needs    Medical: Not on file    Non-medical: Not on file  Tobacco Use  . Smoking status: Never Smoker  . Smokeless tobacco: Never Used  Substance and Sexual Activity  . Alcohol use: Not Currently  . Drug use: Never  . Sexual activity: Yes  Lifestyle  . Physical activity    Days per week: Not on file    Minutes per session: Not on file  . Stress: Not on file  Relationships  . Social Herbalist on phone: Not on file    Gets together: Not on file    Attends religious service: Not on file    Active member of club or organization: Not on file    Attends meetings of clubs or organizations: Not on file    Relationship  status: Not on file  . Intimate partner violence    Fear of current or ex partner: Not on file    Emotionally abused: Not on file    Physically abused: Not on file    Forced sexual activity: Not on file  Other Topics Concern  . Not on file  Social History Narrative  . Not on file     Physical Exam: BP 118/80 (BP Location: Left Arm, Patient Position: Sitting, Cuff Size: Normal)   Pulse 80   Temp 97.7 F (36.5 C) (Other (Comment))   Ht 5\' 5"  (1.651 m)   Wt 158 lb (71.7 kg)   BMI 26.29 kg/m  Constitutional: generally well-appearing Psychiatric: alert and oriented x3 Eyes: extraocular movements intact Mouth: oral pharynx moist, no lesions Neck: supple no lymphadenopathy Cardiovascular: heart regular rate and rhythm Lungs: clear to  auscultation bilaterally Abdomen: soft, nontender, nondistended, no obvious ascites, no peritoneal signs, normal bowel sounds Extremities: no lower extremity edema bilaterally Skin: no lesions on visible extremities   Assessment and plan: 43 y.o. male with Hemoccult positive stool, epigastric pain, dysphasia  First I am not sure why he was screened for colon cancer.  He is only 43.  That being said he was found to have Hemoccult positive stool and he needs a colonoscopy to better understand.  Second he has intermittent epigastric pains and dysphasia.  The epigastric pains seem to be improved since starting proton pump inhibitor and H2 blocker which is only taken on an as-needed basis.  He has lost about 10 pounds net since this started.  He has minor intermittent dysphasia to solid foods.  At the same time as the colonoscopy I recommended an upper endoscopy for him.    Please see the "Patient Instructions" section for addition details about the plan.   48, MD [facility] 08/05/2019, 8:45 AM  Cc: Rodriguez-Southworth, S*

## 2019-09-05 ENCOUNTER — Other Ambulatory Visit: Payer: Self-pay

## 2019-09-05 ENCOUNTER — Ambulatory Visit (AMBULATORY_SURGERY_CENTER): Payer: Self-pay | Admitting: Gastroenterology

## 2019-09-05 ENCOUNTER — Encounter: Payer: Self-pay | Admitting: Gastroenterology

## 2019-09-05 VITALS — BP 134/78 | HR 65 | Temp 98.2°F | Resp 26 | Ht 65.0 in | Wt 158.0 lb

## 2019-09-05 DIAGNOSIS — R1013 Epigastric pain: Secondary | ICD-10-CM

## 2019-09-05 DIAGNOSIS — K209 Esophagitis, unspecified without bleeding: Secondary | ICD-10-CM

## 2019-09-05 DIAGNOSIS — R195 Other fecal abnormalities: Secondary | ICD-10-CM

## 2019-09-05 DIAGNOSIS — K921 Melena: Secondary | ICD-10-CM

## 2019-09-05 MED ORDER — SODIUM CHLORIDE 0.9 % IV SOLN: 500.0000 mL | Freq: Once | INTRAVENOUS | Status: AC

## 2019-09-05 NOTE — Op Note (Signed)
[facility] Patient Name: Ivan DullJose Rodriguez Procedure Date: 09/05/2019 2:40 PM MRN: [id number] Endoscopist: Ivan Feeaniel P Alleene Stoy , MD Age: [age] Referring MD:  Date of Birth: 1976-10-09 Gender: Male Account #: [id number] Procedure:                Colonoscopy Indications:              Heme positive stool Medicines:                Monitored Anesthesia Care Procedure:                Pre-Anesthesia Assessment:                           - Prior to the procedure, a History and Physical                            was performed, and patient medications and                            allergies were reviewed. The patient's tolerance of                            previous anesthesia was also reviewed. The risks                            and benefits of the procedure and the sedation                            options and risks were discussed with the patient.                            All questions were answered, and informed consent                            was obtained. Prior Anticoagulants: The patient has                            taken no previous anticoagulant or antiplatelet                            agents. ASA Grade Assessment: II - A patient with                            mild systemic disease. After reviewing the risks                            and benefits, the patient was deemed in                            satisfactory condition to undergo the procedure.                           After obtaining informed consent, the colonoscope  was passed under direct vision. Throughout the                            procedure, the patient's blood pressure, pulse, and                            oxygen saturations were monitored continuously. The                            Colonoscope was introduced through the anus and                            advanced to the the cecum, identified by                            appendiceal orifice and ileocecal valve.  The                            colonoscopy was performed without difficulty. The                            patient tolerated the procedure well. The quality                            of the bowel preparation was good. The ileocecal                            valve, appendiceal orifice, and rectum were                            photographed. Scope In: 3:01:55 PM Scope Out: 3:11:45 PM Scope Withdrawal Time: 0 hours 7 minutes 5 seconds  Total Procedure Duration: 0 hours 9 minutes 50 seconds  Findings:                 The entire examined colon appeared normal on direct                            and retroflexion views. Complications:            No immediate complications. Estimated blood loss:                            None. Estimated Blood Loss:     Estimated blood loss: none. Impression:               - The entire examined colon is normal on direct and                            retroflexion views.                           - No polyps or cancers. Recommendation:           - Patient has a contact number available for  emergencies. The signs and symptoms of potential                            delayed complications were discussed with the                            patient. Return to normal activities tomorrow.                            Written discharge instructions were provided to the                            patient.                           - Resume previous diet.                           - Continue present medications.                           - Repeat colonoscopy in 10 years for screening                            purposes. Ivan Fee, MD 09/05/2019 3:13:04 PM This report has been signed electronically.

## 2019-09-05 NOTE — Progress Notes (Signed)
Report to PACU, RN, vss, BBS= Clear.  

## 2019-09-05 NOTE — Patient Instructions (Addendum)
YOU HAD AN ENDOSCOPIC PROCEDURE TODAY AT THE [facility]:   Refer to the procedure report that was given to you for any specific questions about what was found during the examination.  If the procedure report does not answer your questions, please call your gastroenterologist to clarify.  If you requested that your care partner not be given the details of your procedure findings, then the procedure report has been included in a sealed envelope for you to review at your convenience later.  YOU SHOULD EXPECT: Some feelings of bloating in the abdomen. Passage of more gas than usual.  Walking can help get rid of the air that was put into your GI tract during the procedure and reduce the bloating. If you had a lower endoscopy (such as a colonoscopy or flexible sigmoidoscopy) you may notice spotting of blood in your stool or on the toilet paper. If you underwent a bowel prep for your procedure, you may not have a normal bowel movement for a few days.  Please Note:  You might notice some irritation and congestion in your nose or some drainage.  This is from the oxygen used during your procedure.  There is no need for concern and it should clear up in a day or so.  SYMPTOMS TO REPORT IMMEDIATELY:   Following lower endoscopy (colonoscopy or flexible sigmoidoscopy):  Excessive amounts of blood in the stool  Significant tenderness or worsening of abdominal pains  Swelling of the abdomen that is new, acute  Fever of 100F or higher   Following upper endoscopy (EGD)  Vomiting of blood or coffee ground material  New chest pain or pain under the shoulder blades  Painful or persistently difficult swallowing  New shortness of breath  Fever of 100F or higher  Black, tarry-looking stools  For urgent or emergent issues, a gastroenterologist can be reached at any hour by calling [phone].   DIET:  We do recommend a small meal at first, but then you may proceed to your regular diet.  Drink  plenty of fluids but you should avoid alcoholic beverages for 24 hours.  MEDICATIONS: Continue present medications. Increase dose of antiacid medicine: We've sent a new prescription for Omeprazole 40 mg pills, take on pill twice daily (before breakfast and your evening dinner meal), dispense 60 tablets with 11 refills.  Please see handouts given to you by your recovery nurse.  ACTIVITY:  You should plan to take it easy for the rest of today and you should NOT DRIVE or use heavy machinery until tomorrow (because of the sedation medicines used during the test).    FOLLOW UP: Our staff will call the number listed on your records 48-72 hours following your procedure to check on you and address any questions or concerns that you may have regarding the information given to you following your procedure. If we do not reach you, we will leave a message.  We will attempt to reach you two times.  During this call, we will ask if you have developed any symptoms of COVID 19. If you develop any symptoms (ie: fever, flu-like symptoms, shortness of breath, cough etc.) before then, please call [phone].  If you test positive for Covid 19 in the 2 weeks post procedure, please call and report this information to us.    If any biopsies were taken you will be contacted by phone or by letter within the next 1-3 weeks.  Please call us at [phone] if you have not heard  about the biopsies in 3 weeks.   Thank you for allowing [place] to provide for your healthcare needs today.   SIGNATURES/CONFIDENTIALITY: You and/or your care partner have signed paperwork which will be entered into your electronic medical record.  These signatures attest to the fact that that the information above on your After Visit Summary has been reviewed and is understood.  Full responsibility of the confidentiality of this discharge information lies with you and/or your care-partner.USTED TUVO UN PROCEDIMIENTO ENDOSCPICO HOY EN [facility]:   Lea el informe del procedimiento que se le entreg para cualquier pregunta especfica sobre lo que se Dentist.  Si el informe del examen no responde a sus preguntas, por favor llame a su gastroenterlogo para aclararlo.  Si usted solicit que no se le den [employer] de lo que se Clinical cytogeneticist en su procedimiento al [employer] va a cuidar, entonces el informe del procedimiento se ha incluido en un sobre sellado para que usted lo revise despus cuando le sea ms conveniente.   LO QUE PUEDE ESPERAR: Algunas sensaciones de hinchazn en el abdomen.  Puede tener ms gases de lo normal.  El caminar puede ayudarle a eliminar el aire que se le puso en el tracto gastrointestinal durante el procedimiento y reducir la hinchazn.  Si le hicieron una endoscopia inferior (como una colonoscopia o una sigmoidoscopia flexible), podra notar manchas de sangre en las heces fecales o en el papel higinico.  Si se someti a una preparacin intestinal para su procedimiento, es posible que no tenga una evacuacin intestinal normal durante [employer].   Tenga en cuenta:  Es posible que note un poco de irritacin y congestin en la nariz o algn drenaje.  Esto es debido al oxgeno [employer] durante su procedimiento.  No hay que preocuparse y esto debe desaparecer ms o Regulatory affairs officer.   SNTOMAS PARA REPORTAR INMEDIATAMENTE:  Despus de una endoscopia inferior (colonoscopia o sigmoidoscopia flexible):  Cantidades excesivas de sangre en las heces fecales  Sensibilidad significativa o empeoramiento de los dolores abdominales   Hinchazn aguda del abdomen que antes no tena   Fiebre de 100F o ms   Despus de la endoscopia superior (EGD)  Vmitos de Retail buyer o material como caf molido   Dolor en el pecho o dolor debajo de los omplatos que antes no tena   Dolor o dificultad persistente para tragar  Falta de aire que antes no tena   Fiebre de 100F o ms  Heces fecales negras y  pegajosas   Para asuntos urgentes o de Associate Professor, puede comunicarse con un gastroenterlogo a cualquier hora llamando al [phone].  DIETA:  Recomendamos una comida pequea al principio, pero luego puede continuar con su dieta normal.  Tome muchos lquidos, Tax adviser las bebidas alcohlicas durante 24 horas.    ACTIVIDAD:  Debe planear tomarse las cosas con calma por el resto del da y no debe CONDUCIR ni usar maquinaria pesada Patent examiner (debido a los medicamentos de sedacin utilizados durante el examen).     SEGUIMIENTO: Nuestro personal llamar al nmero que aparece en su historial al siguiente da hbil de su procedimiento para ver cmo se siente y para responder cualquier pregunta o inquietud que pueda tener con respecto a la informacin que se le dio despus del procedimiento. Si no podemos contactarle, le dejaremos un mensaje.  Sin embargo, si se siente bien y no tiene English as a second language teacher, no es necesario que nos devuelva la  llamada.  Asumiremos que ha regresado a sus actividades diarias normales sin incidentes. Si se le tomaron algunas biopsias, le contactaremos por telfono o por carta en las prximas 3 semanas.  Si no ha sabido [employer] biopsias en el transcurso de 3 semanas, por favor llmenos al [phone].   FIRMAS/CONFIDENCIALIDAD: Usted y/o el acompaante que le cuide han firmado documentos que se ingresarn en su historial mdico electrnico.  Estas firmas atestiguan el hecho de que la informacin anterior

## 2019-09-05 NOTE — Op Note (Signed)
[place] Patient Name: Ivan Rodriguez Procedure Date: 09/05/2019 2:40 PM MRN: [id number] Endoscopist: Milus Banister , MD Age: 43 Referring MD:  Date of Birth: [birth year] Gender: Male Account #: [id number] Procedure:                Upper GI endoscopy Indications:              Epigastric abdominal pain, Dysphagia Medicines:                Monitored Anesthesia Care Procedure:                Pre-Anesthesia Assessment:                           - Prior to the procedure, a History and Physical                            was performed, and patient medications and                            allergies were reviewed. The patient's tolerance of                            previous anesthesia was also reviewed. The risks                            and benefits of the procedure and the sedation                            options and risks were discussed with the patient.                            All questions were answered, and informed consent                            was obtained. Prior Anticoagulants: The patient has                            taken no previous anticoagulant or antiplatelet                            agents. ASA Grade Assessment: II - A patient with                            mild systemic disease. After reviewing the risks                            and benefits, the patient was deemed in                            satisfactory condition to undergo the procedure.                           After obtaining informed consent, the endoscope was  passed under direct vision. Throughout the                            procedure, the patient's blood pressure, pulse, and                            oxygen saturations were monitored continuously. The                            Endoscope was introduced through the mouth, and                            advanced to the second part of duodenum. The upper                            GI  endoscopy was accomplished without difficulty.                            The patient tolerated the procedure well. Scope In: Scope Out: Findings:                 LA Grade A (one or more mucosal breaks greater than                            5 mm, not extending between the tops of two mucosal                            folds) esophagitis was found in the distal                            esophagus.                           The exam was otherwise without abnormality. Complications:            No immediate complications. Estimated blood loss:                            None. Estimated Blood Loss:     Estimated blood loss: none. Impression:               - LA Grade A reflux esophagitis.                           - The examination was otherwise normal.                           - No specimens collected. Recommendation:           - Patient has a contact number available for                            emergencies. The signs and symptoms of potential                            delayed complications were discussed with the  patient. Return to normal activities tomorrow.                            Written discharge instructions were provided to the                            patient.                           - Resume previous diet.                           - Will call in increased dosage of your antiacid                            medicine: new script for omeprazole 40mg  pills, one                            pill twice daily (before breakfast and dinner                            meals), disp 60 with 11 refills. Rachael Feeaniel P Malone Vanblarcom, MD 09/05/2019 3:24:06 PM This report has been signed electronically.

## 2019-09-05 NOTE — Progress Notes (Signed)
Vital signs by CW. Temp by LC. Interpreter, [place],  present during interview

## 2019-09-08 ENCOUNTER — Telehealth: Payer: Self-pay | Admitting: Gastroenterology

## 2019-09-08 MED ORDER — OMEPRAZOLE 40 MG PO CPDR: 40.0000 mg | DELAYED_RELEASE_CAPSULE | Freq: Two times a day (BID) | ORAL | 3 refills | Status: AC

## 2019-09-08 NOTE — Telephone Encounter (Signed)
I have sent the omeprazole to the pharmacy and also answered questions regarding esophagitis and some gas pain the pt is having since procedures.  He was advised to try gas ex and if the pain did not resolve to call back.  No other symptoms noted.  He was also advised to have a full liquid diet for the rest of today and advance as can tolerate.  We spoke with the help of language line interpreter # [phone] The pt has been advised of the information and verbalized understanding.

## 2019-09-08 NOTE — Telephone Encounter (Signed)
Pt daughter is calling and stated that Dr. Ardis Hughs had prescribed omeprazole. He was already prescribed omeprazole from another Dr. But Dr. Ardis Hughs was going to increase the dosage and send in a new script but the pharm does not have the prescription from Dr. Ardis Hughs. Asking for a phone call back with someone who speaks spanish as the mother is the one requesting the call back and only speaks spanish and has some more questions that she is wanting to ask.

## 2019-09-09 ENCOUNTER — Telehealth: Payer: Self-pay

## 2019-09-09 NOTE — Telephone Encounter (Signed)
Covid-19 screening questions   Do you now or have you had a fever in the last 14 days? No.  Do you have any respiratory symptoms of shortness of breath or cough now or in the last 14 days? No.  Do you have any family members or close contacts with diagnosed or suspected Covid-19 in the past 14 days? No.  Have you been tested for Covid-19 and found to be positive? No.       Follow up Call-  Call back number 09/05/2019  Post procedure Call Back phone  # [phone]  Permission to leave phone message Yes  Some recent data might be hidden     Patient questions:  Do you have a fever, pain , or abdominal swelling? No. Pain Score  0 *  Have you tolerated food without any problems? Yes.    Have you been able to return to your normal activities? Yes.    Do you have any questions about your discharge instructions: Diet   No. Medications  No. Follow up visit  No.  Do you have questions or concerns about your Care? No.  Spoke with pt.'s relative Darrall Dears as directed for F/U call.   To the best of our ability to communicate she indicated pt. Is doing well.  I was not 314% certain of the level of ability to interpret/communicate.  Actions: * If pain score is 4 or above: No action needed, pain <4.

## 2019-11-27 IMAGING — DX ABDOMEN - 1 VIEW
1 series · 1 of 1 positions shown · non-contrast
Comparison: None.

CLINICAL DATA: Epigastric pressure; constipation

EXAM:
ABDOMEN - 1 VIEW

[abdomen kub]
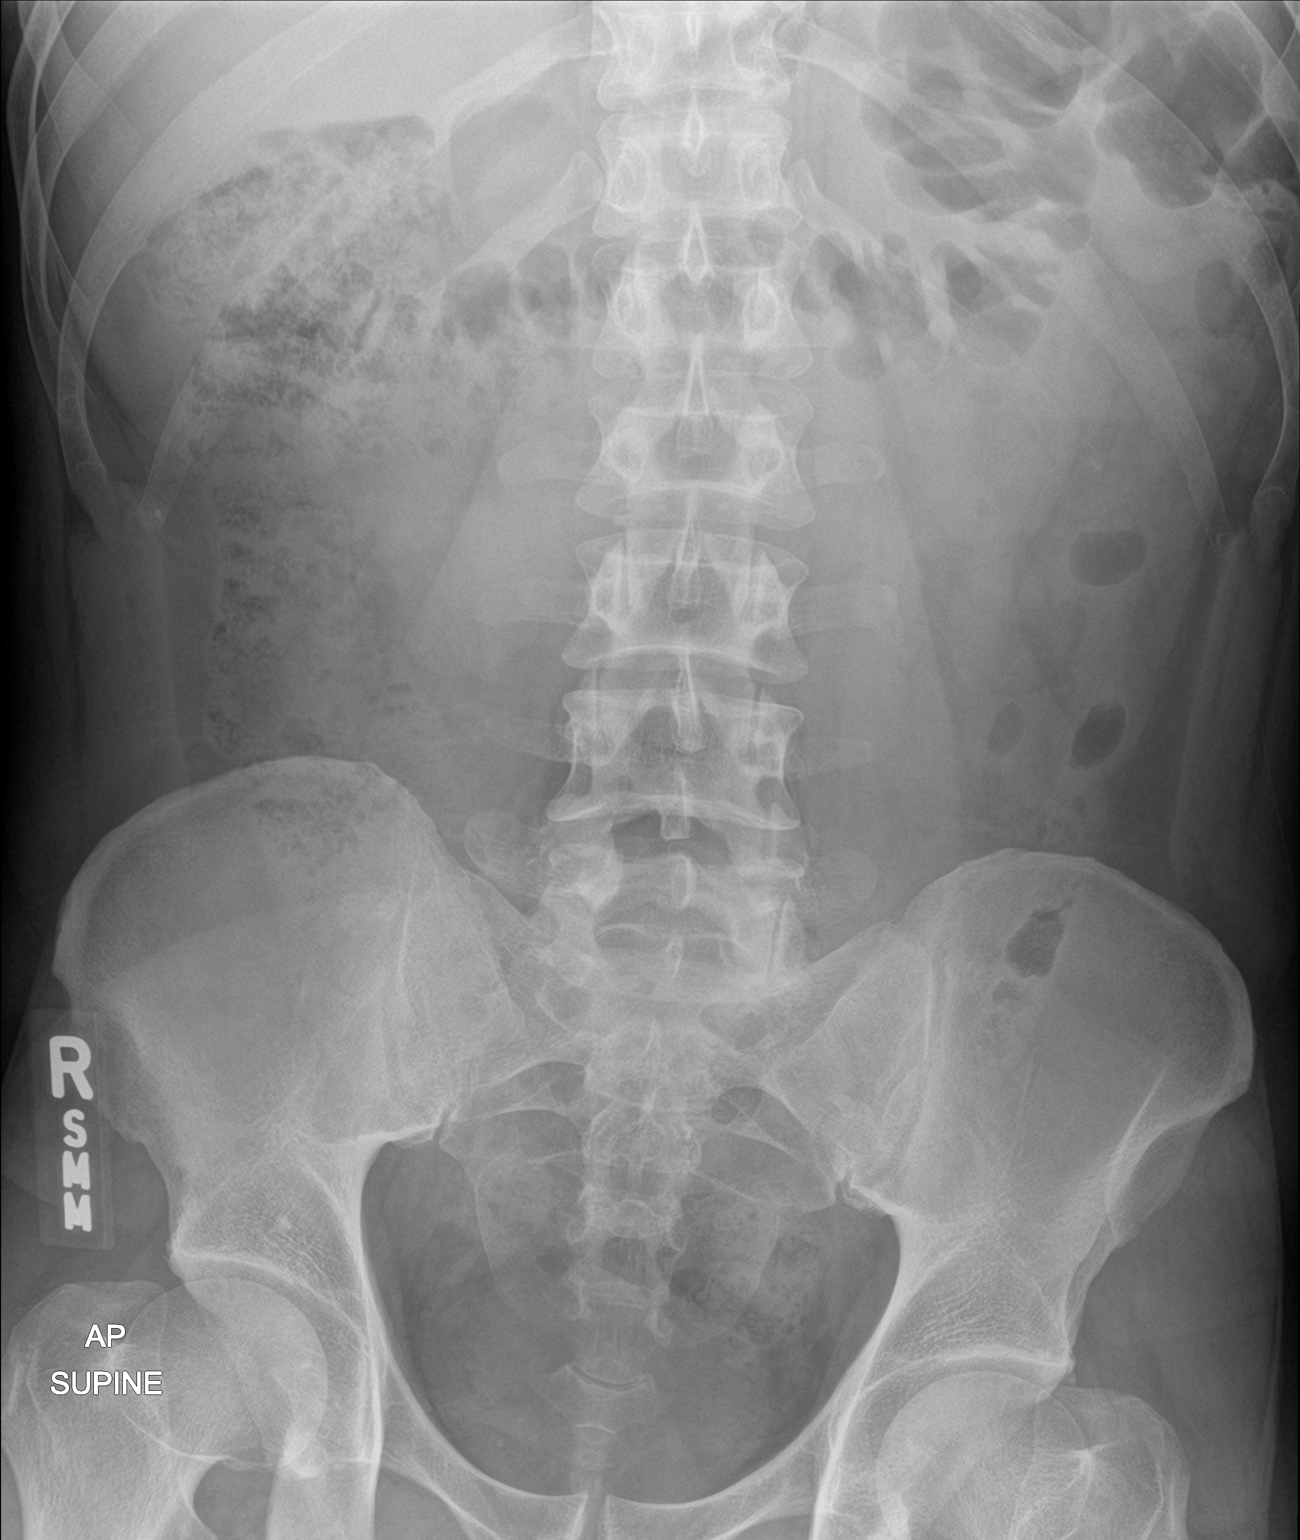

[1 of 1 positions shown; findings below may reference images not displayed]

FINDINGS: There is moderate stool throughout the colon. There is no bowel
dilatation or air-fluid level to suggest bowel obstruction. No free
air. No abnormal calcifications.
IMPRESSION: Moderate stool in colon.  No bowel obstruction or free air evident.

## 2019-12-24 IMAGING — DX LEFT RIBS AND CHEST - 3+ VIEW
3 series · 3 of 3 positions shown · non-contrast
Comparison: None.

CLINICAL DATA: Acute LEFT chest and rib pain following fall 5 days
ago. Initial encounter.

EXAM:
LEFT RIBS AND CHEST - 3+ VIEW

[chest pa]
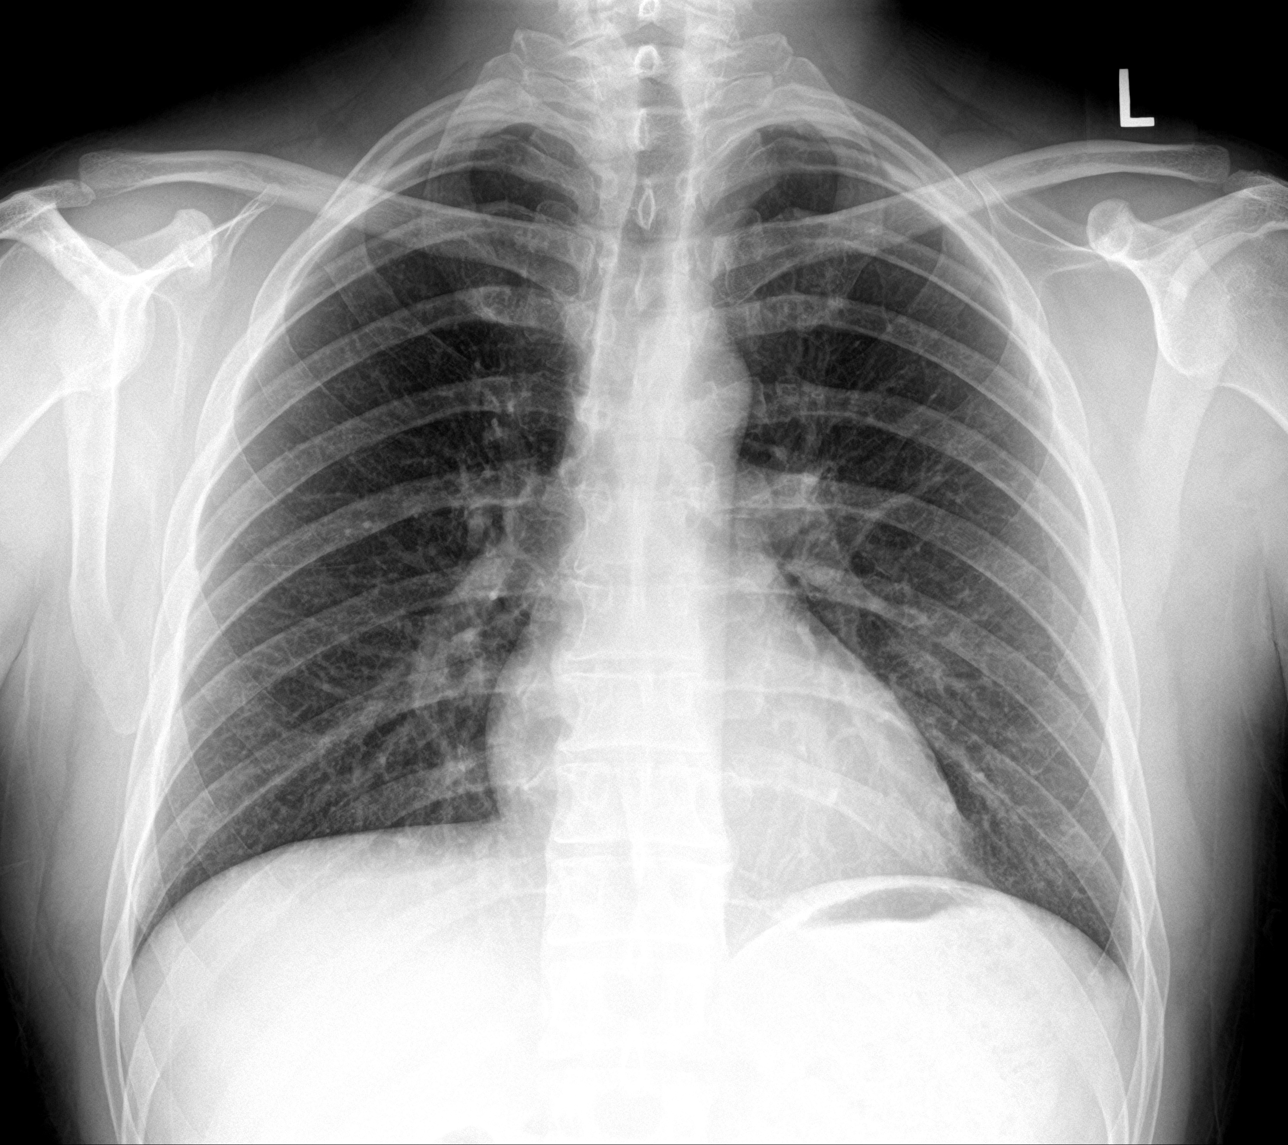

[rib obl]
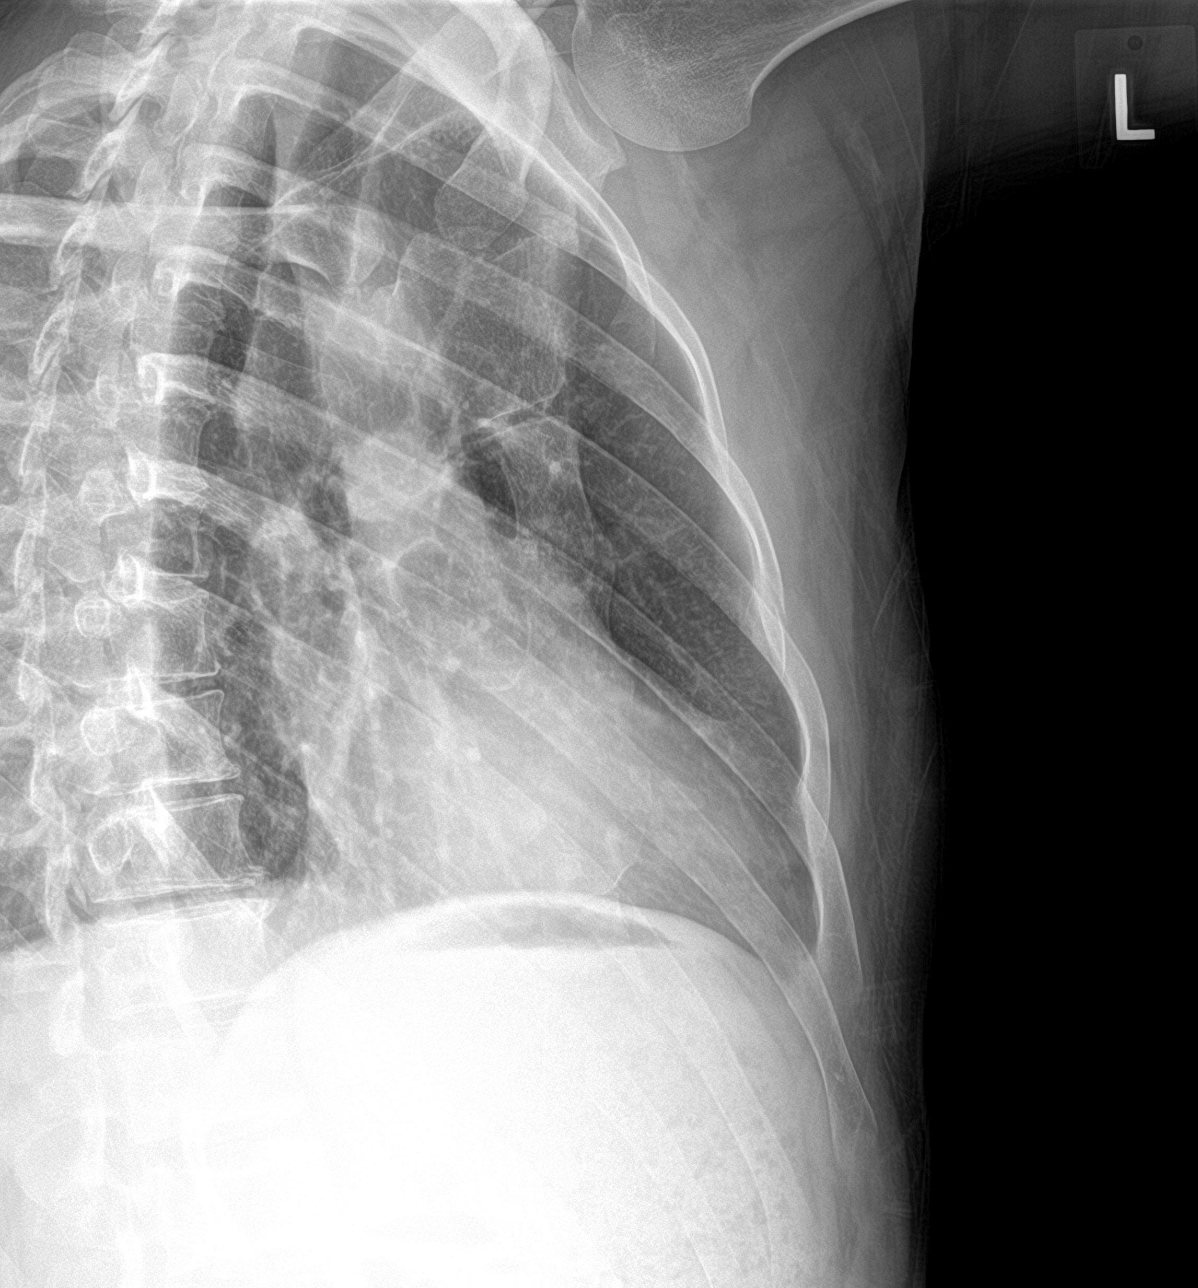

[rib pa]
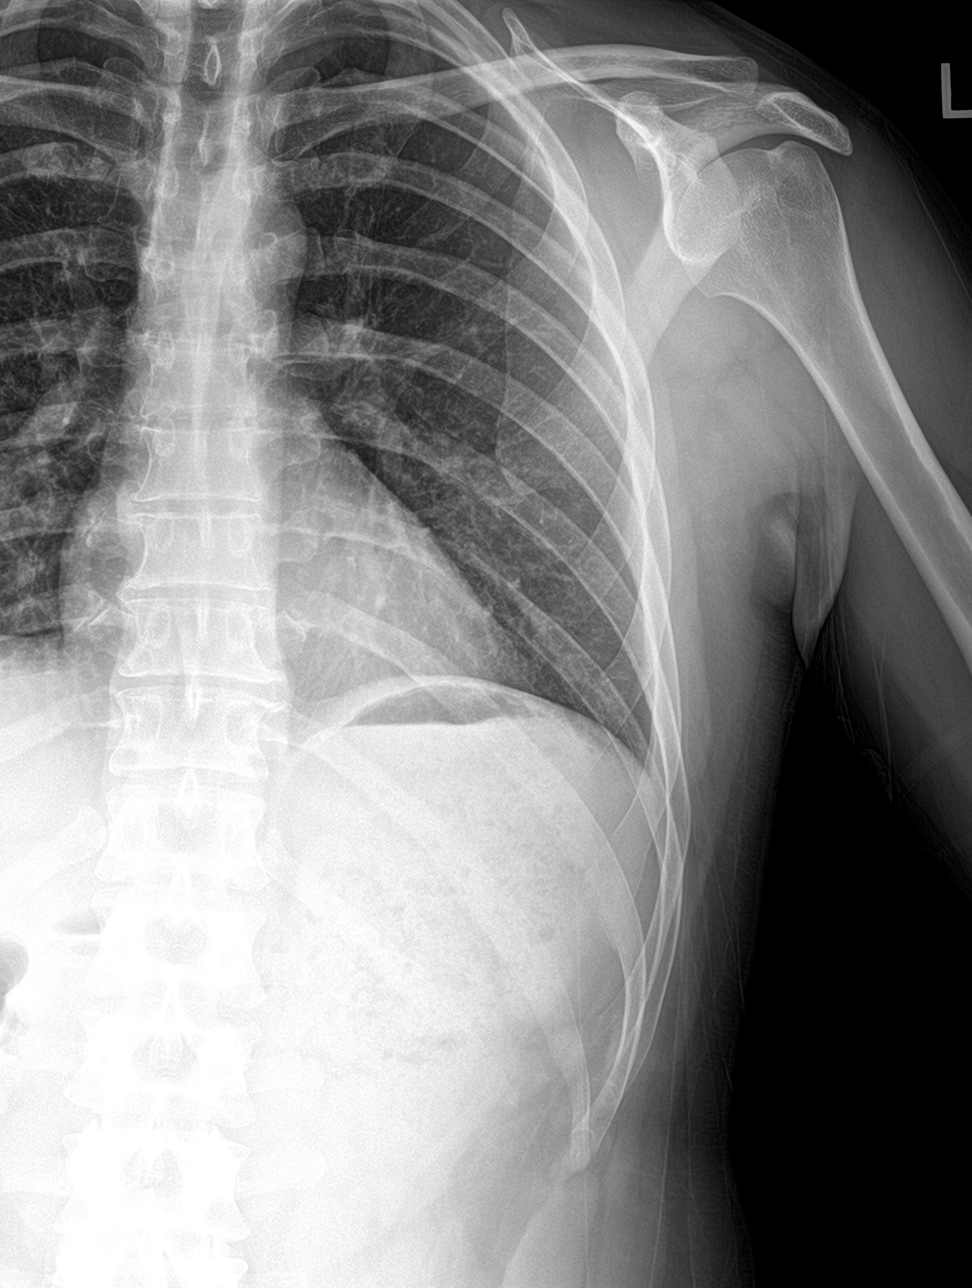

[3 of 3 positions shown; findings below may reference images not displayed]

FINDINGS: A nondisplaced fracture of the anterior LEFT 5th rib is noted.

The cardiomediastinal silhouette is unremarkable.

There is no evidence of focal airspace disease, pulmonary edema,
suspicious pulmonary nodule/mass, pleural effusion, or pneumothorax.

No other acute bony abnormalities are identified.
IMPRESSION: Nondisplaced LEFT 5th rib fracture. No other significant
abnormalities.

## 2021-03-16 ENCOUNTER — Other Ambulatory Visit: Payer: Self-pay

## 2021-03-16 ENCOUNTER — Ambulatory Visit (HOSPITAL_COMMUNITY)
Admission: EM | Admit: 2021-03-16 | Discharge: 2021-03-16 | Disposition: A | Discharge status: Home / Self Care | Payer: Self-pay | Attending: Physician Assistant | Admitting: Physician Assistant

## 2021-03-16 ENCOUNTER — Encounter (HOSPITAL_COMMUNITY): Payer: Self-pay | Admitting: Emergency Medicine

## 2021-03-16 DIAGNOSIS — R197 Diarrhea, unspecified: Secondary | ICD-10-CM | POA: Insufficient documentation

## 2021-03-16 DIAGNOSIS — R1013 Epigastric pain: Secondary | ICD-10-CM | POA: Insufficient documentation

## 2021-03-16 DIAGNOSIS — R112 Nausea with vomiting, unspecified: Secondary | ICD-10-CM | POA: Insufficient documentation

## 2021-03-16 DIAGNOSIS — K529 Noninfective gastroenteritis and colitis, unspecified: Secondary | ICD-10-CM | POA: Insufficient documentation

## 2021-03-16 DIAGNOSIS — R109 Unspecified abdominal pain: Secondary | ICD-10-CM | POA: Insufficient documentation

## 2021-03-16 LAB — CBC WITH DIFFERENTIAL/PLATELET
Abs Immature Granulocytes: 0.01 10*3/uL (ref 0.00–0.07)
Basophils Absolute: 0 10*3/uL (ref 0.0–0.1)
Basophils Relative: 0 %
Eosinophils Absolute: 0 10*3/uL (ref 0.0–0.5)
Eosinophils Relative: 0 %
HCT: 49.1 % (ref 39.0–52.0)
Hemoglobin: 16.5 g/dL (ref 13.0–17.0)
Immature Granulocytes: 0 %
Lymphocytes Relative: 8 %
Lymphs Abs: 0.5 10*3/uL — ABNORMAL LOW (ref 0.7–4.0)
MCH: 28 pg (ref 26.0–34.0)
MCHC: 33.6 g/dL (ref 30.0–36.0)
MCV: 83.4 fL (ref 80.0–100.0)
Monocytes Absolute: 0.5 10*3/uL (ref 0.1–1.0)
Monocytes Relative: 9 %
Neutro Abs: 5.2 10*3/uL (ref 1.7–7.7)
Neutrophils Relative %: 83 %
Platelets: 183 10*3/uL (ref 150–400)
RBC: 5.89 MIL/uL — ABNORMAL HIGH (ref 4.22–5.81)
RDW: 13.3 % (ref 11.5–15.5)
WBC: 6.2 10*3/uL (ref 4.0–10.5)
nRBC: 0 % (ref 0.0–0.2)

## 2021-03-16 LAB — COMPREHENSIVE METABOLIC PANEL
ALT: 25 U/L (ref 0–44)
AST: 27 U/L (ref 15–41)
Albumin: 4.2 g/dL (ref 3.5–5.0)
Alkaline Phosphatase: 68 U/L (ref 38–126)
Anion gap: 8 (ref 5–15)
BUN: 19 mg/dL (ref 6–20)
CO2: 27 mmol/L (ref 22–32)
Calcium: 9.7 mg/dL (ref 8.9–10.3)
Chloride: 103 mmol/L (ref 98–111)
Creatinine, Ser: 0.82 mg/dL (ref 0.61–1.24)
GFR, Estimated: >60 mL/min (ref >60)
Glucose, Bld: 118 mg/dL — ABNORMAL HIGH (ref 70–99)
Potassium: 4.5 mmol/L (ref 3.5–5.1)
Sodium: 138 mmol/L (ref 135–145)
Total Bilirubin: 0.5 mg/dL (ref 0.3–1.2)
Total Protein: 7.1 g/dL (ref 6.5–8.1)

## 2021-03-16 LAB — LIPASE, BLOOD: Lipase: 28 U/L (ref 11–51)

## 2021-03-16 MED ORDER — ONDANSETRON 8 MG PO TBDP: 8.0000 mg | ORAL_TABLET | Freq: Three times a day (TID) | ORAL | 0 refills | Status: AC | PRN

## 2021-03-16 NOTE — ED Triage Notes (Signed)
Pt presents with abdominal pain and diarrhea xs 1-2 days.

## 2021-03-16 NOTE — ED Provider Notes (Signed)
[facility]    CSN: [id number] Arrival date & time: 03/16/21  3818      History   Chief Complaint Chief Complaint  Patient presents with  . Abdominal Pain  . Diarrhea  . Emesis    HPI Ivan Rodriguez is a 45 y.o. male.   Patient Spanish-speaking and interpreter was utilized during visit.  Reports a 1 day history of abdominal pain, nausea, vomiting, diarrhea.  Denies any melena, hematemesis, hematochezia.  He has not tried any over-the-counter medications.  Reports pain is rated 5 on a 0-10 pain scale, localized to epigastrium with radiation throughout right abdomen, described as aching, no aggravating or alleviating factors identified.  Denies history of gastrointestinal disorder.  Denies any known sick contacts.  Denies any suspicious food intake, recent travel, known sick contacts, medication changes.  Denies treatment with GLP-1 agonist.  Denies history of alcohol use.  He reports decreased oral intake including solid food and liquids as a result of symptoms.  Denies previous abdominal surgery and has both gallbladder and appendix.     Past Medical History:  Diagnosis Date  . GERD (gastroesophageal reflux disease)    stable with diet    Patient Active Problem List   Diagnosis Date Noted  . GERD (gastroesophageal reflux disease) 01/27/2019  . Left elbow pain 06/09/2017  . Olecranon bursitis of left elbow 06/09/2017    History reviewed. No pertinent surgical history.     Home Medications    Prior to Admission medications   Medication Sig Start Date End Date Taking? Authorizing Provider  ondansetron (ZOFRAN ODT) 8 MG disintegrating tablet Take 1 tablet (8 mg total) by mouth every 8 (eight) hours as needed for nausea or vomiting. 03/16/21  Yes Amar Sippel K, PA-C  famotidine (PEPCID) 20 MG tablet Take one 20 minutes prior to eating spicy food. 05/29/19   Rodriguez-Southworth, Nettie Elm, PA-C  omeprazole (PRILOSEC) 40 MG capsule Take 1 capsule (40 mg  total) by mouth 2 (two) times daily. 09/08/19   Rachael Fee, MD    Family History Family History  Problem Relation Age of Onset  . Hypertension Mother   . Gallstones Mother   . Throat cancer Father   . Colon cancer Neg Hx   . Rectal cancer Neg Hx   . Stomach cancer Neg Hx     Social History Social History   Tobacco Use  . Smoking status: Never Smoker  . Smokeless tobacco: Never Used  Vaping Use  . Vaping Use: Never used  Substance Use Topics  . Alcohol use: Not Currently    Comment: quit 5 years ago  . Drug use: Never     Allergies   Patient has no known allergies.   Review of Systems Review of Systems  Constitutional: Negative for activity change, appetite change, fatigue and fever.  Respiratory: Negative for cough and shortness of breath.   Cardiovascular: Negative for chest pain.  Gastrointestinal: Positive for abdominal pain, diarrhea, nausea and vomiting.  Neurological: Negative for dizziness, light-headedness and headaches.     Physical Exam Triage Vital Signs ED Triage Vitals  Enc Vitals Group     BP 03/16/21 1035 117/77     Pulse Rate 03/16/21 1035 93     Resp 03/16/21 1035 17     Temp 03/16/21 1035 99.7 F (37.6 C)     Temp Source 03/16/21 1035 Oral     SpO2 03/16/21 1035 97 %     Weight --  Height --      Head Circumference --      Peak Flow --      Pain Score 03/16/21 1033 3     Pain Loc --      Pain Edu? --      Excl. in GC? --    No data found.  Updated Vital Signs BP 117/77 (BP Location: Right Arm)   Pulse 93   Temp 99.7 F (37.6 C) (Oral)   Resp 17   SpO2 97%   Visual Acuity Right Eye Distance:   Left Eye Distance:   Bilateral Distance:    Right Eye Near:   Left Eye Near:    Bilateral Near:     Physical Exam Vitals reviewed.  Constitutional:      General: He is awake.     Appearance: Normal appearance. He is normal weight. He is not ill-appearing.     Comments: Very pleasant male appears stated age in no  acute distress  HENT:     Head: Normocephalic and atraumatic.     Mouth/Throat:     Pharynx: No oropharyngeal exudate or posterior oropharyngeal erythema.  Cardiovascular:     Rate and Rhythm: Normal rate and regular rhythm.     Heart sounds: No murmur heard.   Pulmonary:     Effort: Pulmonary effort is normal.     Breath sounds: Normal breath sounds. No stridor. No wheezing, rhonchi or rales.     Comments: Clear to auscultation bilaterally Abdominal:     General: Bowel sounds are normal.     Palpations: Abdomen is soft.     Tenderness: There is abdominal tenderness in the right upper quadrant, right lower quadrant and epigastric area. There is no right CVA tenderness, left CVA tenderness, guarding or rebound. Negative signs include McBurney's sign, psoas sign and obturator sign.       Comments: Mild tender to palpation throughout epigastrium and right abdomen.  No evidence of acute abdomen on physical exam.  Neurological:     Mental Status: He is alert.  Psychiatric:        Behavior: Behavior is cooperative.      UC Treatments / Results  Labs (all labs ordered are listed, but only abnormal results are displayed) Labs Reviewed  CBC WITH DIFFERENTIAL/PLATELET  COMPREHENSIVE METABOLIC PANEL  LIPASE, BLOOD    EKG   Radiology No results found.  Procedures Procedures (including critical care time)  Medications Ordered in UC Medications - No data to display  Initial Impression / Assessment and Plan / UC Course  I have reviewed the triage vital signs and the nursing notes.  Pertinent labs & imaging results that were available during my care of the patient were reviewed by me and considered in my medical decision making (see chart for details).     Vital signs and physical exam reassuring today; no indication for emergent evaluation or imaging.  Discussed that we are unable to obtain abdominal imaging and so if he has persistent pain he would need to go to the ER for  further evaluation.  Suspect viral etiology given short duration and clinical presentation.  Patient was prescribed Zofran to be used up to 3 times a day as needed with instruction to drink plenty of fluid and eat a bland diet.  CBC, CMP, lipase obtained today-results pending.  If patient has elevated transaminases or leukocytosis needs to go to ER.  Strict return precautions given to which patient expressed understanding.  Final Clinical Impressions(s) /  UC Diagnoses   Final diagnoses:  Nausea vomiting and diarrhea  Gastroenteritis  Epigastric pain  Right sided abdominal pain     Discharge Instructions     Take Zofran up to 3 times a day as needed for nausea.  Make sure to drink plenty of fluids and eat a bland diet.  If you have any worsening symptoms including persistent nausea/vomiting or worsening abdominal pain you need to go to the hospital as we discussed.    ED Prescriptions    Medication Sig Dispense Auth. Provider   ondansetron (ZOFRAN ODT) 8 MG disintegrating tablet Take 1 tablet (8 mg total) by mouth every 8 (eight) hours as needed for nausea or vomiting. 20 tablet Jurnee Nakayama, Noberto Retort, PA-C     PDMP not reviewed this encounter.   Jeani Hawking, PA-C 03/16/21 1156

## 2021-03-16 NOTE — Discharge Instructions (Addendum)
Take Zofran up to 3 times a day as needed for nausea.  Make sure to drink plenty of fluids and eat a bland diet.  If you have any worsening symptoms including persistent nausea/vomiting or worsening abdominal pain you need to go to the hospital as we discussed.
